# Patient Record
Sex: Female | Born: 1980 | Race: Black or African American | Hispanic: No | Marital: Married | Smoking: Never smoker
Health system: Southern US, Community
[De-identification: ages and names within clinical notes are randomized; demographics above are authoritative.]

## PROBLEM LIST (undated history)

## (undated) ENCOUNTER — Inpatient Hospital Stay (HOSPITAL_COMMUNITY): Payer: Self-pay

## (undated) DIAGNOSIS — D219 Benign neoplasm of connective and other soft tissue, unspecified: Secondary | ICD-10-CM

## (undated) DIAGNOSIS — IMO0001 Reserved for inherently not codable concepts without codable children: Secondary | ICD-10-CM

## (undated) HISTORY — DX: Reserved for inherently not codable concepts without codable children: IMO0001

## (undated) HISTORY — PX: REDUCTION MAMMAPLASTY: SUR839

## (undated) HISTORY — PX: BREAST SURGERY: SHX581

---
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
## 2003-01-16 ENCOUNTER — Emergency Department (HOSPITAL_COMMUNITY): Admission: EM | Admit: 2003-01-16 | Discharge: 2003-01-17 | Payer: Self-pay | Admitting: Emergency Medicine

## 2003-01-16 ENCOUNTER — Encounter: Payer: Self-pay | Admitting: Emergency Medicine

## 2007-06-01 ENCOUNTER — Emergency Department (HOSPITAL_COMMUNITY): Admission: EM | Admit: 2007-06-01 | Discharge: 2007-06-01 | Payer: Self-pay | Admitting: Emergency Medicine

## 2007-08-19 ENCOUNTER — Emergency Department (HOSPITAL_COMMUNITY): Admission: EM | Admit: 2007-08-19 | Discharge: 2007-08-19 | Payer: Self-pay | Admitting: Family Medicine

## 2008-10-10 ENCOUNTER — Other Ambulatory Visit: Admission: RE | Admit: 2008-10-10 | Discharge: 2008-10-10 | Payer: Self-pay | Admitting: Gynecology

## 2008-10-10 ENCOUNTER — Encounter: Payer: Self-pay | Admitting: Women's Health

## 2008-10-10 ENCOUNTER — Ambulatory Visit: Payer: Self-pay | Admitting: Women's Health

## 2008-12-20 ENCOUNTER — Ambulatory Visit: Payer: Self-pay | Admitting: Women's Health

## 2009-03-06 ENCOUNTER — Ambulatory Visit: Payer: Self-pay | Admitting: Gynecology

## 2009-03-13 ENCOUNTER — Ambulatory Visit: Payer: Self-pay | Admitting: Gynecology

## 2009-03-17 ENCOUNTER — Ambulatory Visit: Payer: Self-pay | Admitting: Obstetrics and Gynecology

## 2009-03-17 DIAGNOSIS — IMO0001 Reserved for inherently not codable concepts without codable children: Secondary | ICD-10-CM

## 2009-03-17 HISTORY — DX: Reserved for inherently not codable concepts without codable children: IMO0001

## 2009-04-14 ENCOUNTER — Ambulatory Visit: Payer: Self-pay | Admitting: Gynecology

## 2009-05-23 ENCOUNTER — Ambulatory Visit: Payer: Self-pay | Admitting: Gynecology

## 2009-10-11 ENCOUNTER — Other Ambulatory Visit: Admission: RE | Admit: 2009-10-11 | Discharge: 2009-10-11 | Payer: Self-pay | Admitting: Gynecology

## 2009-10-11 ENCOUNTER — Ambulatory Visit: Payer: Self-pay | Admitting: Gynecology

## 2009-11-04 ENCOUNTER — Emergency Department (HOSPITAL_COMMUNITY): Admission: EM | Admit: 2009-11-04 | Discharge: 2009-11-04 | Payer: Self-pay | Admitting: Family Medicine

## 2010-02-28 ENCOUNTER — Ambulatory Visit: Payer: Self-pay | Admitting: Gynecology

## 2010-11-29 ENCOUNTER — Emergency Department: Payer: Self-pay | Admitting: Emergency Medicine

## 2011-04-16 DIAGNOSIS — IMO0001 Reserved for inherently not codable concepts without codable children: Secondary | ICD-10-CM | POA: Insufficient documentation

## 2011-10-22 IMAGING — US US PELV - US TRANSVAGINAL
1 series · 17 of 25 positions shown · non-contrast
Comparison: none

REASON FOR EXAM: LLQ pain;  hx of polycystic ovaries
COMMENTS:   LMP: Four weeks ago

PROCEDURE:     US  - US PELVIS EXAM W/TRANSVAGINAL  - November 29, 2010 [DATE]
RESULT:
Transabdominal and endovaginal imaging of the pelvis was obtained.

[Series 1: us pelv - us transvaginal · 17 of 60 slices shown]
[im 1/60]
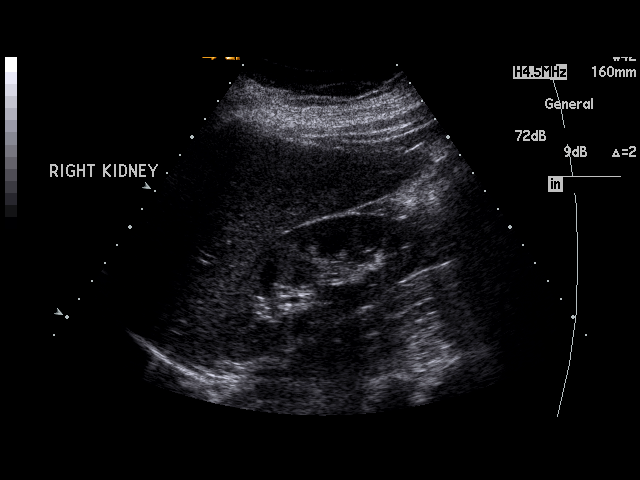
[im 5/60]
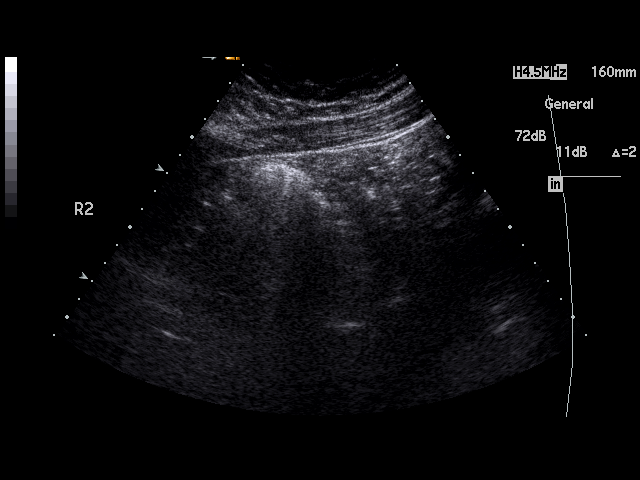
[im 8/60]
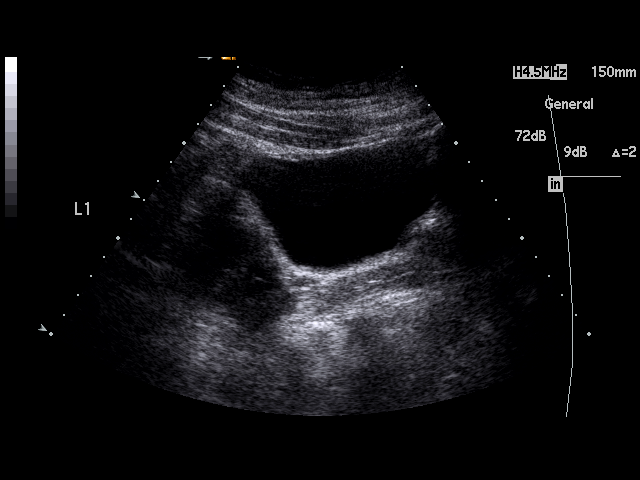
[im 13/60]
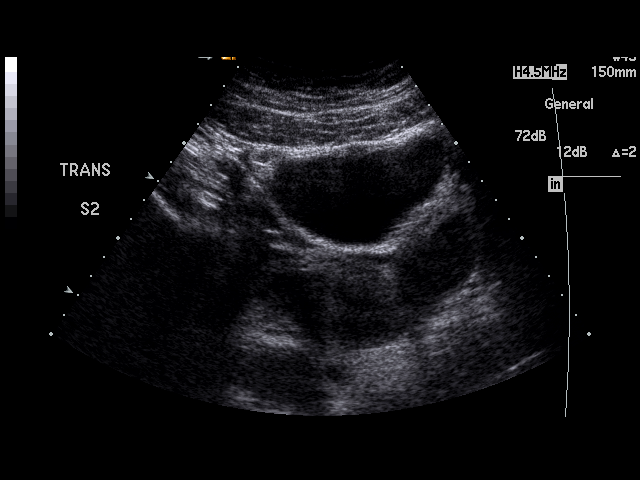
[im 15/60]
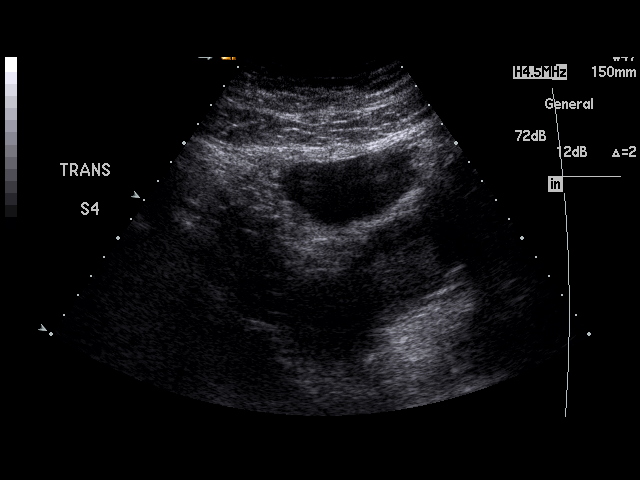
[im 20/60]
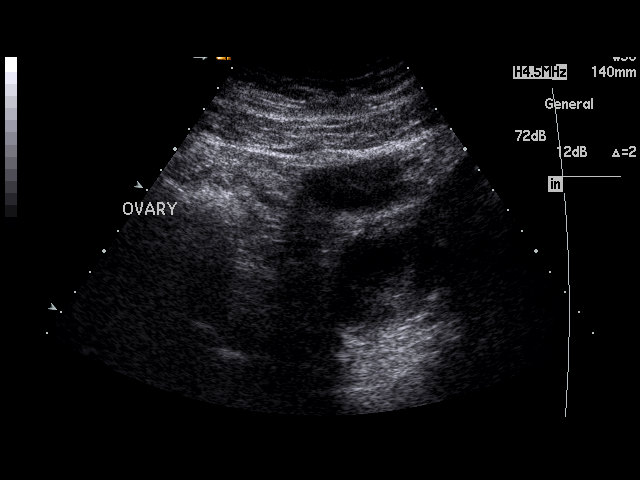
[im 23/60]
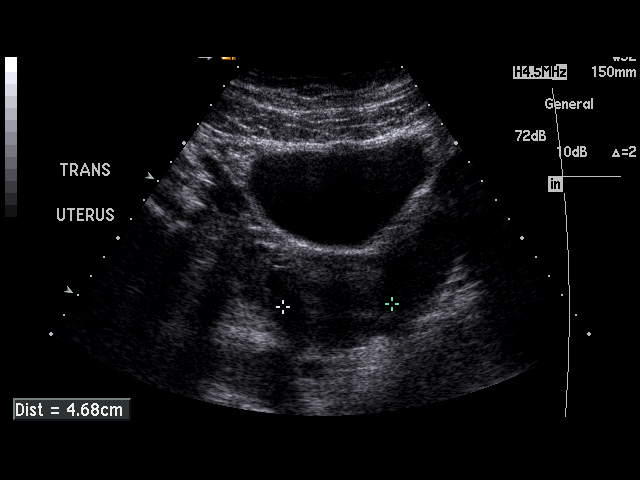
[im 28/60]
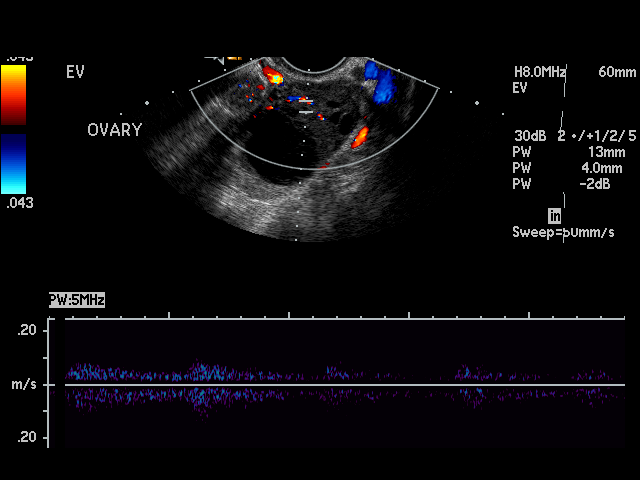
[im 30/60]
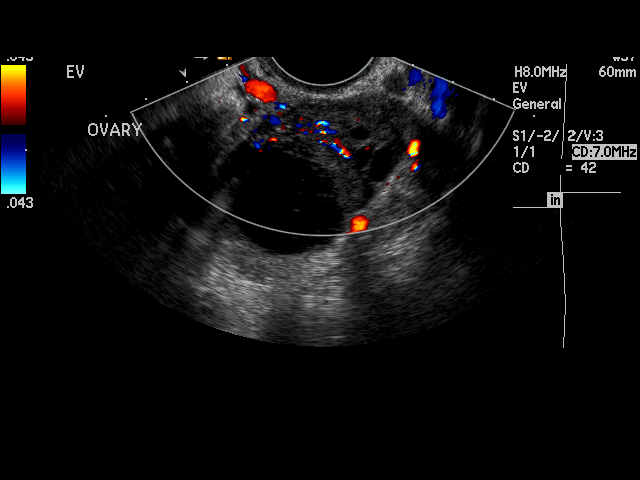
[im 32/60]
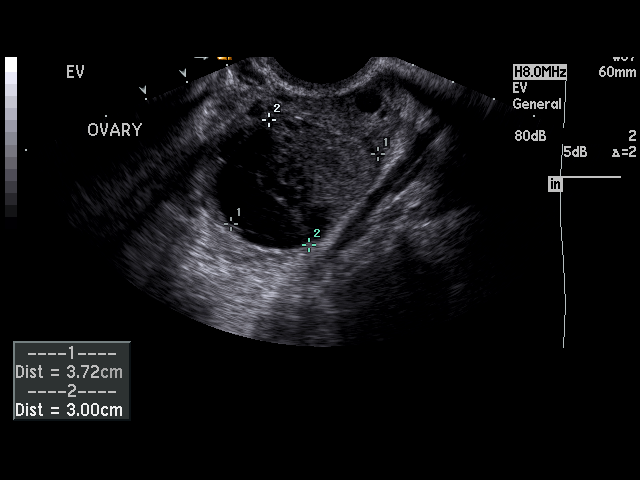
[im 37/60]
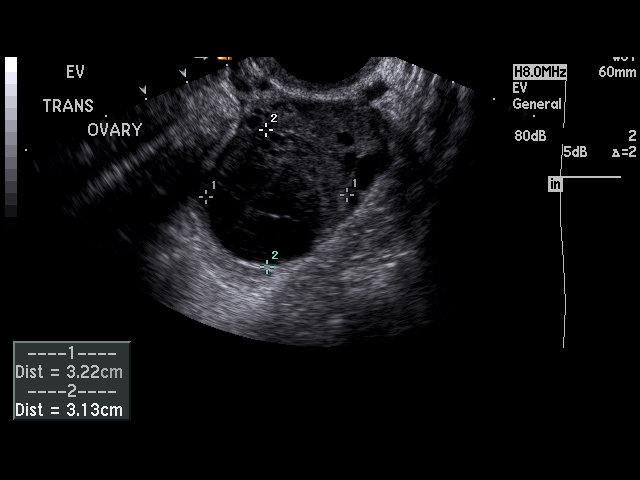
[im 40/60]
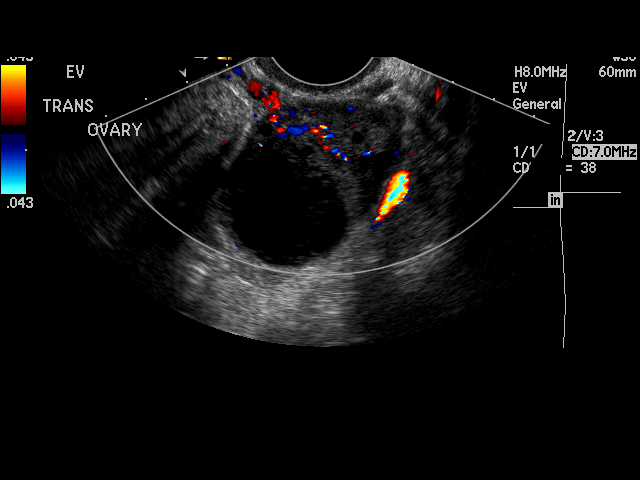
[im 45/60]
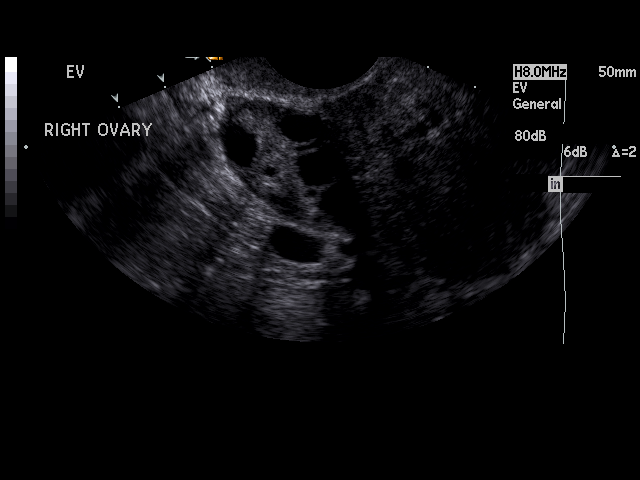
[im 47/60]
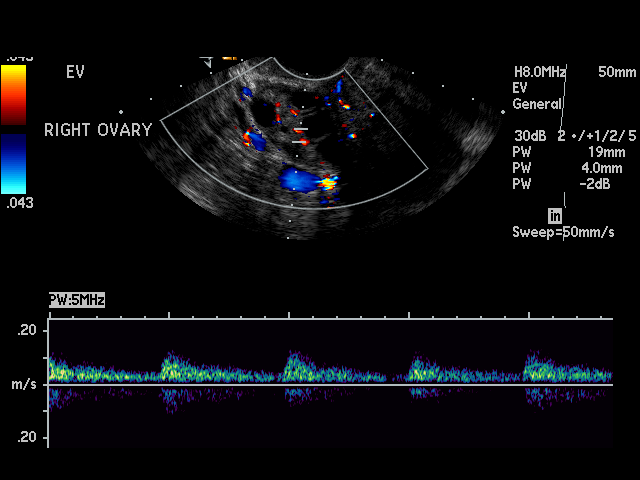
[im 52/60]
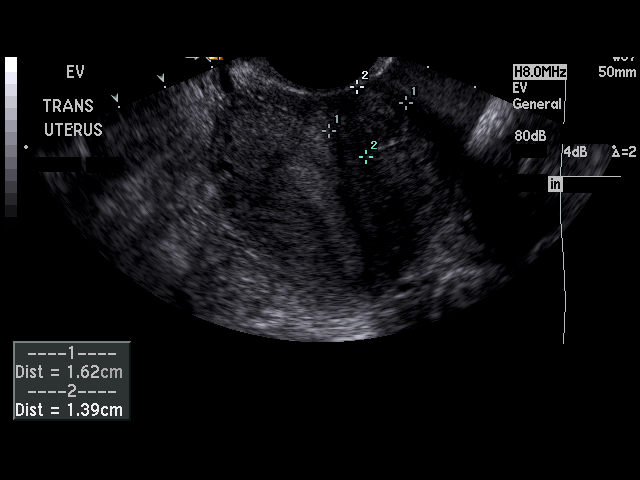
[im 55/60]
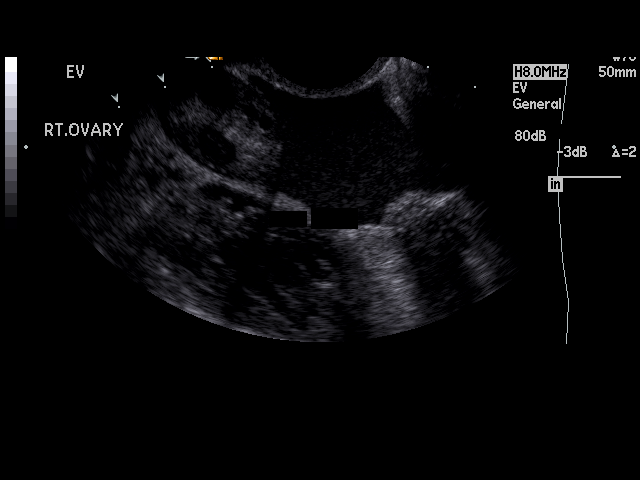
[im 60/60]
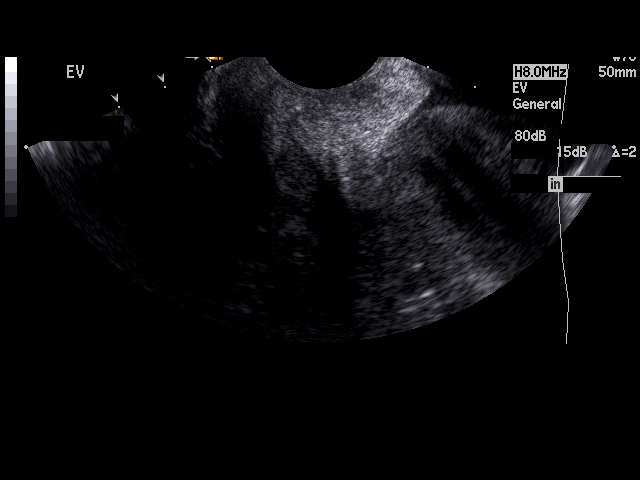

[17 of 25 positions shown; findings below may reference images not displayed]

FINDINGS: The uterus measures 7.73 x 4.21 x 2.68 cm. A leiomyoma is
identified within the uterus measuring 2.02 x 1.83 x 1.62 cm. Endometrial
thickness is 7 mm. The right ovary measures 3.53 x 1.95 x 2.99 cm. Multiple
cysts are identified within the right ovary. The patient has a reported
history of polycystic ovaries.

The left ovary measures 3.34 x 2.09 x 3.14 cm. A complex, partially cystic
mass is appreciated involving the left ovary measuring 3.72 x 3 x 3.22 cm.
This finding may represent the sequela of a hemorrhagic cyst. Color flow is
identified within the right and left ovaries as well as arterial and venous
waveforms.

Complex free fluid is identified within the pelvis. There is no evidence of
hydronephrosis.
IMPRESSION: 1.  Multiple cysts involving the right ovary which may represent the sequela
of polycystic ovarian disease. Alternatively, there areas may represent
multiple follicles and clinical correlation is recommended.
2.  Complex versus hemorrhagic cyst involving the left ovary. There is free
complex fluid within the pelvis and these findings raise suspicion of prior
partial rupture.
3.  Uterine fibroid.
4.  Dr. Palmore of the Emergency Department was informed of these findings via
a preliminary faxed report.

## 2017-02-02 ENCOUNTER — Encounter (HOSPITAL_COMMUNITY): Payer: Self-pay | Admitting: *Deleted

## 2017-02-02 ENCOUNTER — Inpatient Hospital Stay (HOSPITAL_COMMUNITY)
Admission: AD | Admit: 2017-02-02 | Discharge: 2017-02-02 | Disposition: A | Discharge status: Home / Self Care | Payer: BLUE CROSS/BLUE SHIELD | Source: Ambulatory Visit | Attending: Obstetrics and Gynecology | Admitting: Obstetrics and Gynecology

## 2017-02-02 ENCOUNTER — Inpatient Hospital Stay (HOSPITAL_COMMUNITY): Payer: BLUE CROSS/BLUE SHIELD

## 2017-02-02 DIAGNOSIS — O26891 Other specified pregnancy related conditions, first trimester: Secondary | ICD-10-CM | POA: Diagnosis not present

## 2017-02-02 DIAGNOSIS — D259 Leiomyoma of uterus, unspecified: Secondary | ICD-10-CM | POA: Insufficient documentation

## 2017-02-02 DIAGNOSIS — R109 Unspecified abdominal pain: Secondary | ICD-10-CM

## 2017-02-02 DIAGNOSIS — O209 Hemorrhage in early pregnancy, unspecified: Secondary | ICD-10-CM | POA: Diagnosis not present

## 2017-02-02 DIAGNOSIS — O3410 Maternal care for benign tumor of corpus uteri, unspecified trimester: Secondary | ICD-10-CM

## 2017-02-02 DIAGNOSIS — R1030 Lower abdominal pain, unspecified: Secondary | ICD-10-CM | POA: Insufficient documentation

## 2017-02-02 DIAGNOSIS — Z3A00 Weeks of gestation of pregnancy not specified: Secondary | ICD-10-CM | POA: Diagnosis not present

## 2017-02-02 DIAGNOSIS — O3411 Maternal care for benign tumor of corpus uteri, first trimester: Secondary | ICD-10-CM | POA: Diagnosis not present

## 2017-02-02 DIAGNOSIS — N888 Other specified noninflammatory disorders of cervix uteri: Secondary | ICD-10-CM | POA: Diagnosis not present

## 2017-02-02 DIAGNOSIS — O3680X0 Pregnancy with inconclusive fetal viability, not applicable or unspecified: Secondary | ICD-10-CM | POA: Insufficient documentation

## 2017-02-02 HISTORY — DX: Benign neoplasm of connective and other soft tissue, unspecified: D21.9

## 2017-02-02 LAB — URINALYSIS, ROUTINE W REFLEX MICROSCOPIC
Bilirubin Urine: NEGATIVE
Glucose, UA: NEGATIVE mg/dL
Ketones, ur: NEGATIVE mg/dL
Leukocytes, UA: NEGATIVE
Nitrite: NEGATIVE
PROTEIN: NEGATIVE mg/dL
SPECIFIC GRAVITY, URINE: 1.014 (ref 1.005–1.030)
pH: 5 (ref 5.0–8.0)

## 2017-02-02 LAB — CBC
HCT: 32.3 % — ABNORMAL LOW (ref 36.0–46.0)
Hemoglobin: 11.1 g/dL — ABNORMAL LOW (ref 12.0–15.0)
MCH: 27.3 pg (ref 26.0–34.0)
MCHC: 34.4 g/dL (ref 30.0–36.0)
MCV: 79.4 fL (ref 78.0–100.0)
PLATELETS: 274 10*3/uL (ref 150–400)
RBC: 4.07 MIL/uL (ref 3.87–5.11)
RDW: 14 % (ref 11.5–15.5)
WBC: 5.4 10*3/uL (ref 4.0–10.5)

## 2017-02-02 LAB — WET PREP, GENITAL
CLUE CELLS WET PREP: NONE SEEN
Sperm: NONE SEEN
Trich, Wet Prep: NONE SEEN
YEAST WET PREP: NONE SEEN

## 2017-02-02 LAB — ABO/RH: ABO/RH(D): AB POS

## 2017-02-02 LAB — HCG, QUANTITATIVE, PREGNANCY: HCG, BETA CHAIN, QUANT, S: 2418 m[IU]/mL — AB (ref <5)

## 2017-02-02 LAB — POCT PREGNANCY, URINE: PREG TEST UR: POSITIVE — AB

## 2017-02-02 MED ORDER — CONCEPT OB 130-92.4-1 MG PO CAPS: 1.0000 | ORAL_CAPSULE | Freq: Every day | ORAL | 12 refills | Status: DC

## 2017-02-02 NOTE — MAU Provider Note (Signed)
Chief Complaint: Possible Pregnancy; Vaginal Bleeding; and Abdominal Pain   First Provider Initiated Contact with Patient 02/02/17 1649     SUBJECTIVE HPI: Erin Greene is a 36 y.o. G1P0 at [redacted]w[redacted]d who presents to Maternity Admissions reporting vaginal bleeding and mild-moderate low abdominal cramping. Cramping is been going on for 1 week. Vaginal bleeding started immediately prior to coming to maternity admissions. Patient lives in [place] and is visit [place] this weekend. She has been under the care of an infertility specialist, but conceived spontaneously. 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 Had pregnancy confirmed 01/30/2017. HCG level was 400.   Vaginal Bleeding: Small amount Passage of tissue or clots: Denies Dizziness: Denies  AB POS  Pain Location: Suprapubic Quality: Cramping Severity: 4/10 on pain scale Duration: One week Course: Unchanged Context: Early pregnancy Timing: Intermittent Modifying factors: Slightly worse with position changes. Hasn't tried anything for the pain. Associated signs and symptoms: Negative for fever, chills, nausea, vomiting, diarrhea, constipation, urinary complaints, vaginal discharge. Positive for vaginal bleeding.  Past Medical History:  Diagnosis Date  . Fibroid   . IUD 03-17-2009   MIRENA   OB History  Gravida Para Term Preterm AB Living  1            SAB TAB Ectopic Multiple Live Births               # Outcome Date GA Lbr Len/2nd Weight Sex Delivery Anes PTL Lv  1 Current              Past Surgical History:  Procedure Laterality Date  . BREAST SURGERY     reduction   Social History   Social History  . Marital status: Married    Spouse name: N/A  . Number of children: N/A  . Years of education: N/A   Occupational History  . Not on file.   Social History Main Topics  . Smoking status: Never Smoker  . Smokeless tobacco: Never Used  . Alcohol use No     Comment: not since pregnancy  . Drug use: No   . Sexual activity: Yes    Birth control/ protection: None   Other Topics Concern  . Not on file   Social History Narrative  . No narrative on file   No current facility-administered medications on file prior to encounter.                  Allergies  Allergen Reactions  . Apple     I have reviewed the past Medical Hx, Surgical Hx, Social Hx, Allergies and Medications.   Review of Systems  Constitutional: Negative for appetite change, chills and fever.  Gastrointestinal: Positive for abdominal pain. Negative for abdominal distention, constipation, diarrhea, nausea and vomiting.  Genitourinary: Positive for vaginal bleeding. Negative for dysuria, flank pain, frequency, hematuria, urgency, vaginal discharge and vaginal pain.  Musculoskeletal: Negative for back pain.  Neurological: Negative for dizziness.    OBJECTIVE Patient Vitals for the past 24 hrs:  BP Temp Temp src Pulse Resp SpO2 Weight  02/02/17 1459 120/71 98.3 F (36.8 C) Oral 86 18 100 % 236 lb 1.3 oz (107.1 kg)   Constitutional: Well-developed, well-nourished female in no acute distress.  Cardiovascular: normal rate Respiratory: normal rate and effort.  GI: Abd soft, non-tender. Pos BS x 4 MS: Extremities nontender, no edema, normal ROM Neurologic: Alert and oriented x 4.  GU: Neg CVAT.  SPECULUM EXAM: NEFG, physiologic discharge, scant dark red blood  noted, cervix friable  BIMANUAL: cervix closed/very firm; uterus slightly enlarged, no adnexal tenderness or masses. No CMT.  LAB RESULTS Results for orders placed or performed during the hospital encounter of 02/02/17 (from the past 24 hour(s))  Urinalysis, Routine w reflex microscopic     Status: Abnormal   Collection Time: 02/02/17  2:55 PM  Result Value Ref Range   Color, Urine YELLOW YELLOW   APPearance CLEAR CLEAR   Specific Gravity, Urine 1.014 1.005 - 1.030   pH 5.0 5.0 - 8.0   Glucose, UA NEGATIVE NEGATIVE mg/dL   Hgb urine dipstick LARGE (A)  NEGATIVE   Bilirubin Urine NEGATIVE NEGATIVE   Ketones, ur NEGATIVE NEGATIVE mg/dL   Protein, ur NEGATIVE NEGATIVE mg/dL   Nitrite NEGATIVE NEGATIVE   Leukocytes, UA NEGATIVE NEGATIVE   RBC / HPF TOO NUMEROUS TO COUNT 0 - 5 RBC/hpf   WBC, UA 0-5 0 - 5 WBC/hpf   Bacteria, UA RARE (A) NONE SEEN   Squamous Epithelial / LPF 0-5 (A) NONE SEEN  Pregnancy, urine POC     Status: Abnormal   Collection Time: 02/02/17  3:04 PM  Result Value Ref Range   Preg Test, Ur POSITIVE (A) NEGATIVE  hCG, quantitative, pregnancy     Status: Abnormal   Collection Time: 02/02/17  3:31 PM  Result Value Ref Range   hCG, Beta Chain, Quant, S 2,418 (H) <5 mIU/mL  CBC     Status: Abnormal   Collection Time: 02/02/17  3:31 PM  Result Value Ref Range   WBC 5.4 4.0 - 10.5 K/uL   RBC 4.07 3.87 - 5.11 MIL/uL   Hemoglobin 11.1 (L) 12.0 - 15.0 g/dL   HCT 32.3 (L) 36.0 - 46.0 %   MCV 79.4 78.0 - 100.0 fL   MCH 27.3 26.0 - 34.0 pg   MCHC 34.4 30.0 - 36.0 g/dL   RDW 14.0 11.5 - 15.5 %   Platelets 274 150 - 400 K/uL  ABO/Rh     Status: None (Preliminary result)   Collection Time: 02/02/17  3:31 PM  Result Value Ref Range   ABO/RH(D) AB POS   Wet prep, genital     Status: Abnormal   Collection Time: 02/02/17  4:50 PM  Result Value Ref Range   Yeast Wet Prep HPF POC NONE SEEN NONE SEEN   Trich, Wet Prep NONE SEEN NONE SEEN   Clue Cells Wet Prep HPF POC NONE SEEN NONE SEEN   WBC, Wet Prep HPF POC FEW (A) NONE SEEN   Sperm NONE SEEN     IMAGING US Ob Comp Less 14 Wks  Result Date: 02/02/2017 CLINICAL DATA:  Bleeding and cramping. EXAM: OBSTETRIC <14 WK [place] AND TRANSVAGINAL OB US TECHNIQUE: Both transabdominal and transvaginal ultrasound examinations were performed for complete evaluation of the gestation as well as the maternal uterus, adnexal regions, and pelvic cul-de-sac. Transvaginal technique was performed to assess early pregnancy. COMPARISON:  None. FINDINGS: Intrauterine gestational sac: There is a tiny  fluid collection within the endometrium which is nonspecific. Yolk sac:  Not Visualized. Embryo:  Not Visualized. MSD: 3.8  mm   5 w   0  d CRL:    mm    w    d                  [place] EDC: Subchorionic hemorrhage:  None visualized. Maternal uterus/adnexae: Four fibroids are identified in the uterus. The largest measures 2.1 x 1.5 x 1.9 cm. IMPRESSION: 1.  There is a tiny fluid collection within the endometrium measuring 3.8 mm in mean diameter. This is nonspecific but could represent an early gestational sac. Recommend clinical correlation and close follow-up. 2. Fibroid uterus. Electronically Signed   By: Dorise Bullion III M.D   On: 02/02/2017 16:43   US Ob Transvaginal  Result Date: 02/02/2017 CLINICAL DATA:  Bleeding and cramping. EXAM: OBSTETRIC <14 WK [place] AND TRANSVAGINAL OB US TECHNIQUE: Both transabdominal and transvaginal ultrasound examinations were performed for complete evaluation of the gestation as well as the maternal uterus, adnexal regions, and pelvic cul-de-sac. Transvaginal technique was performed to assess early pregnancy. COMPARISON:  None. FINDINGS: Intrauterine gestational sac: There is a tiny fluid collection within the endometrium which is nonspecific. Yolk sac:  Not Visualized. Embryo:  Not Visualized. MSD: 3.8  mm   5 w   0  d CRL:    mm    w    d                  [place] EDC: Subchorionic hemorrhage:  None visualized. Maternal uterus/adnexae: Four fibroids are identified in the uterus. The largest measures 2.1 x 1.5 x 1.9 cm. IMPRESSION: 1. There is a tiny fluid collection within the endometrium measuring 3.8 mm in mean diameter. This is nonspecific but could represent an early gestational sac. Recommend clinical correlation and close follow-up. 2. Fibroid uterus. Electronically Signed   By: Dorise Bullion III M.D   On: 02/02/2017 16:43    MAU COURSE CBC, Quant, ABO/Rh, ultrasound, wet prep and GC/chlamydia culture, UA  MDM - Pain and bleeding in early pregnancy with pregnancy of  unknown anatomic location, but hemodynamically stable.  - Normal rise in [place] from [place] in [place], although these tests were not drawn from same lab and should be compared with caution. - Bleeding may from friable cervix.   ASSESSMENT 1. Pregnancy of unknown anatomic location   2. Vaginal bleeding in pregnancy, first trimester   3. Abdominal pain during pregnancy in first trimester   4. Friable cervix   5. Uterine fibroid during pregnancy, antepartum     PLAN Discharge home in stable condition. Ectopic, SAB precautions Follow-up Information    OB/GYN Follow up on 02/04/2017.   Why:  For repeat hCG level (pregnancy hormone)       maternity admissions or emergency department Follow up.   Why:  As needed if symptoms worsen         Allergies as of 02/02/2017      Reactions   Apple       Medication List    STOP taking these medications   levonorgestrel 20 MCG/24HR IUD Commonly known as:  MIRENA     TAKE these medications   CONCEPT OB 130-92.4-1 MG Caps Take 1 tablet by mouth daily.            Discharge Care Instructions        Start     Ordered   02/02/17 0000  Prenat w/o A Vit-[id number]-FePo-[id number] (CONCEPT OB) 130-92.4-1 MG CAPS  Daily    Question:  Supervising Provider  Answer:  Jonnie Kind   02/02/17 1739   02/02/17 0000  Discharge patient    Question Answer Comment  Discharge disposition 01-Home or Self Care   Discharge patient date 02/02/2017      02/02/17 [place], [place], [place] 02/02/2017  5:40 PM  4

## 2017-02-02 NOTE — MAU Note (Signed)
+  vaginal bleeding Started today about 30 minutes Red in color; denies clots  +lower abdominal cramping Rating pain 4/10  +headache Rating pain 6/10 Anterior   LMP 7/19

## 2017-02-02 NOTE — Discharge Instructions (Signed)

## 2017-02-03 LAB — HIV ANTIBODY (ROUTINE TESTING W REFLEX): HIV SCREEN 4TH GENERATION: NONREACTIVE

## 2017-02-03 LAB — GC/CHLAMYDIA PROBE AMP (~~LOC~~) NOT AT ARMC
Chlamydia: NEGATIVE
Neisseria Gonorrhea: NEGATIVE

## 2017-12-25 ENCOUNTER — Encounter (HOSPITAL_COMMUNITY): Payer: Self-pay

## 2018-06-26 IMAGING — US US OB COMP LESS 14 WK
1 series · 15 of 28 positions shown · non-contrast
Comparison: None.

CLINICAL DATA: Bleeding and cramping.

EXAM:
OBSTETRIC <14 WK US AND TRANSVAGINAL OB US
TECHNIQUE: Both transabdominal and transvaginal ultrasound examinations were
performed for complete evaluation of the gestation as well as the
maternal uterus, adnexal regions, and pelvic cul-de-sac.
Transvaginal technique was performed to assess early pregnancy.

[Series 1: us ob comp less 14 wk · 15 of 53 slices shown]
[im 1/53]
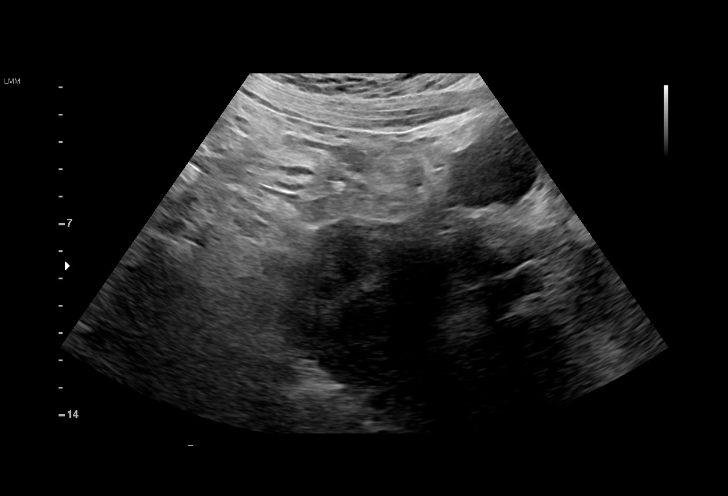
[im 4/53]
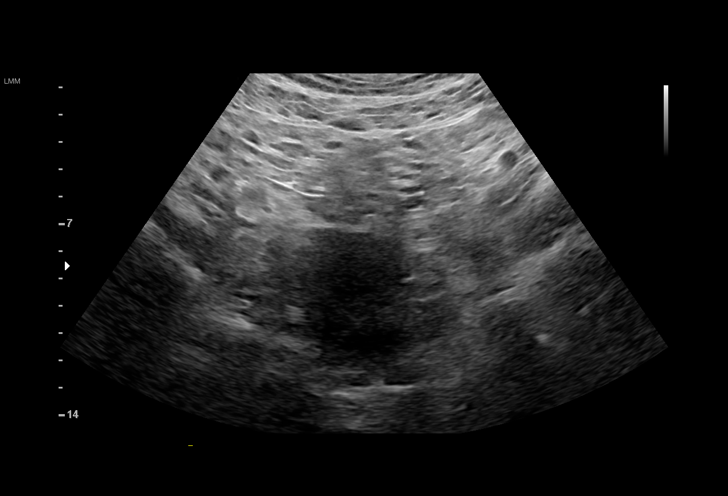
[im 8/53]
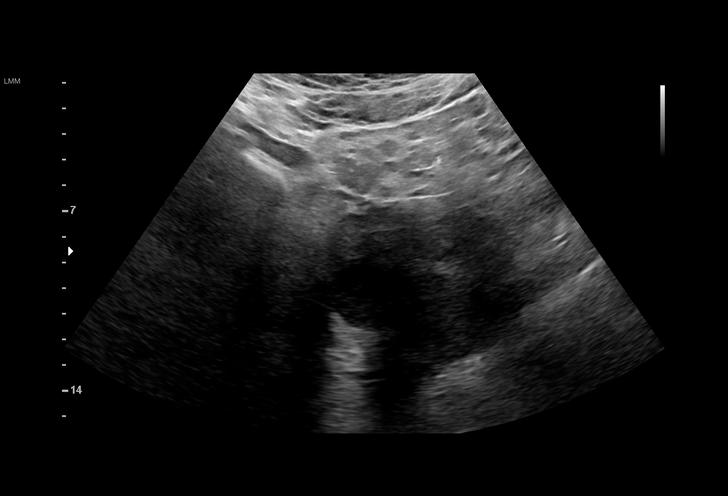
[im 12/53]
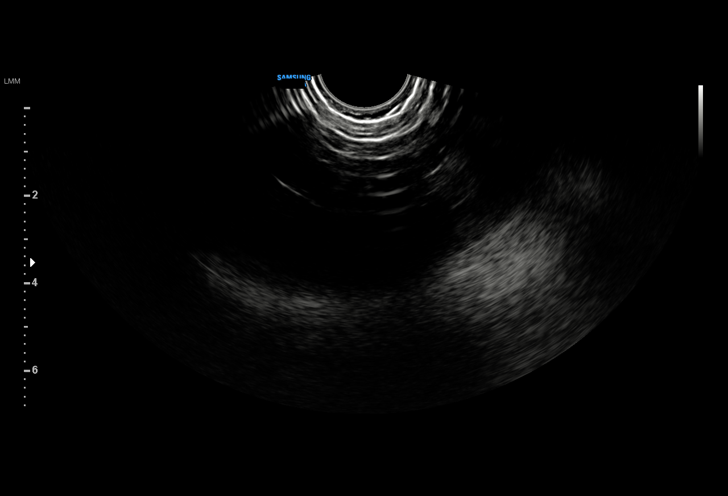
[im 16/53]
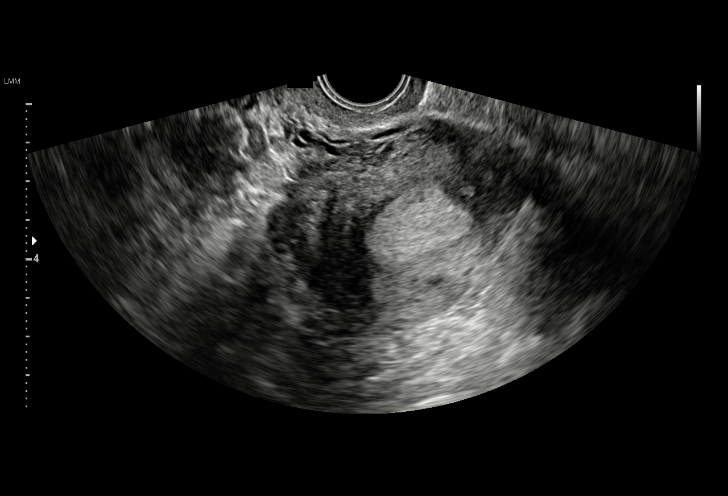
[im 20/53]
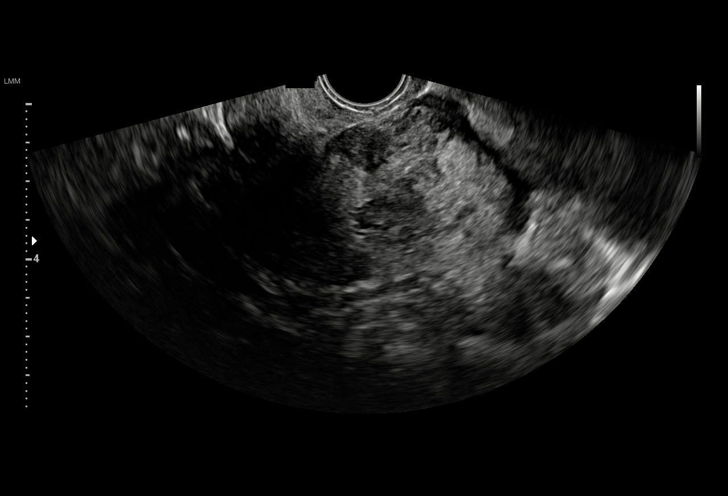
[im 24/53]
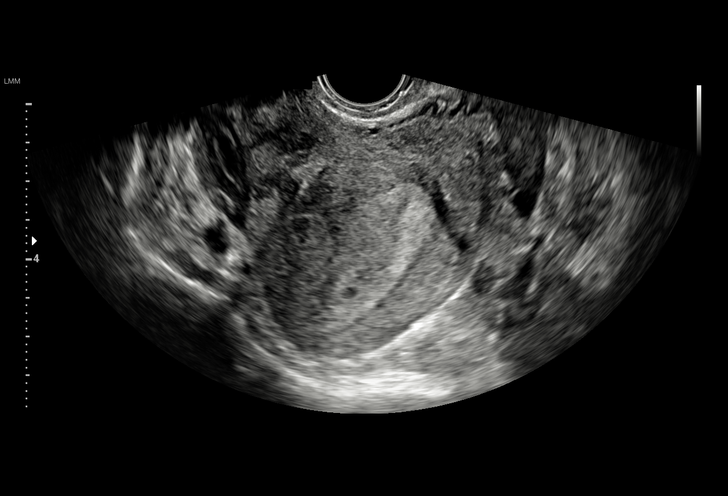
[im 27/53]
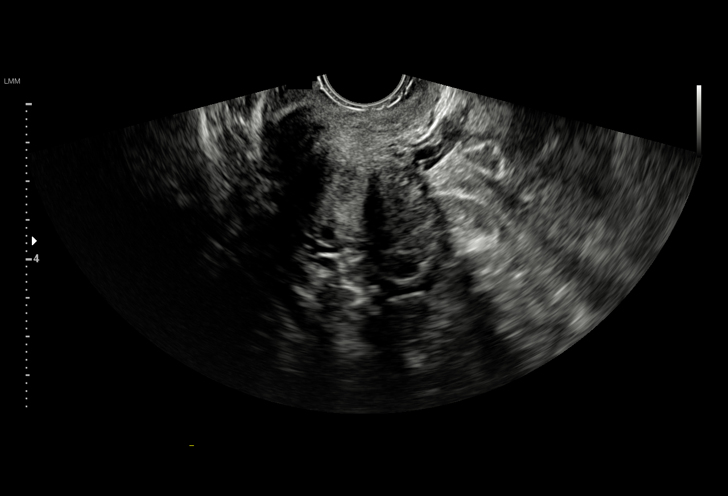
[im 29/53]
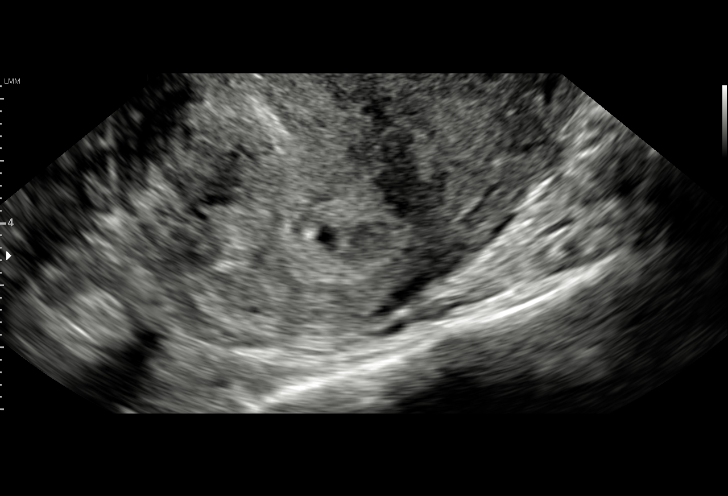
[im 33/53]
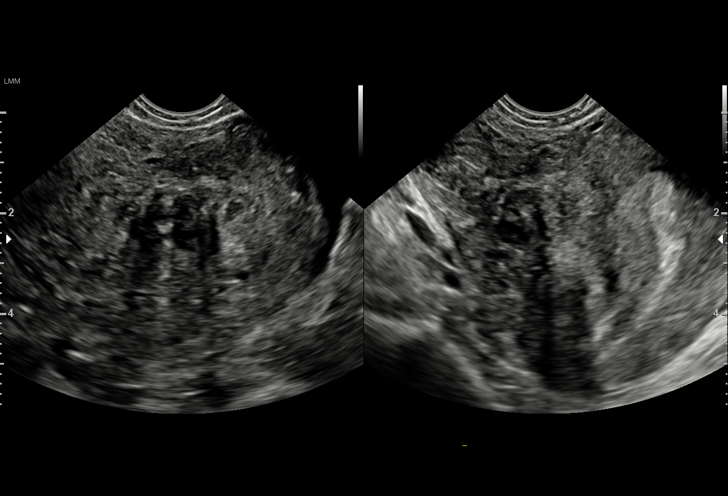
[im 37/53]
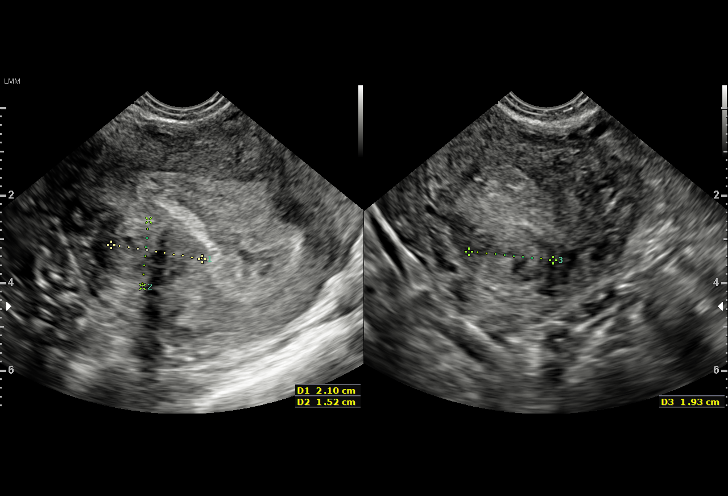
[im 41/53]
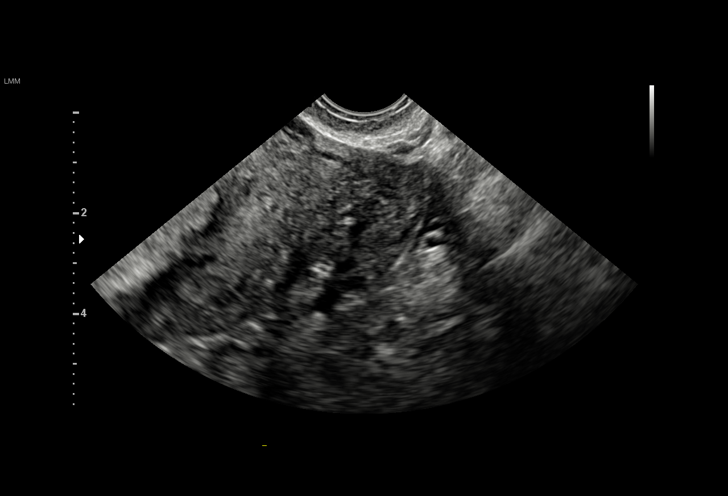
[im 45/53]
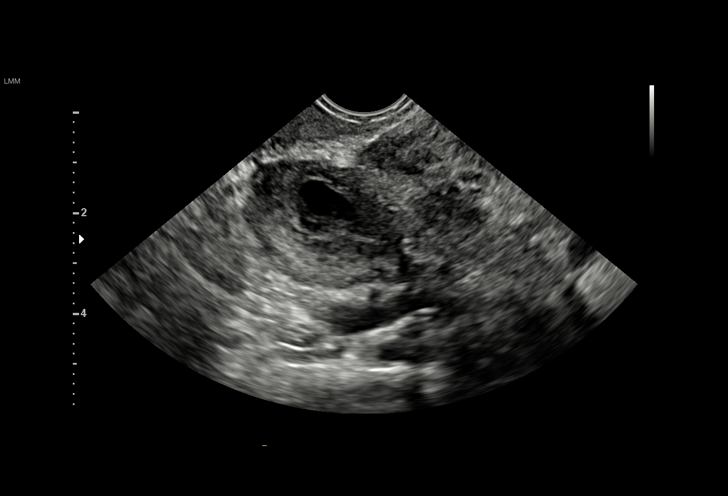
[im 49/53]
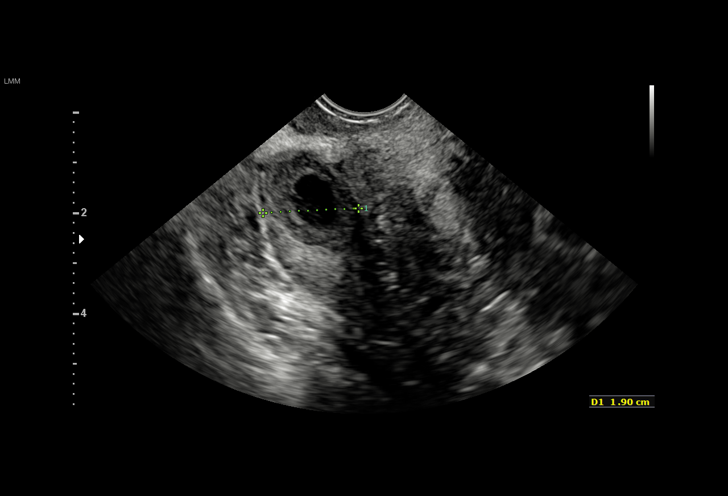
[im 53/53]
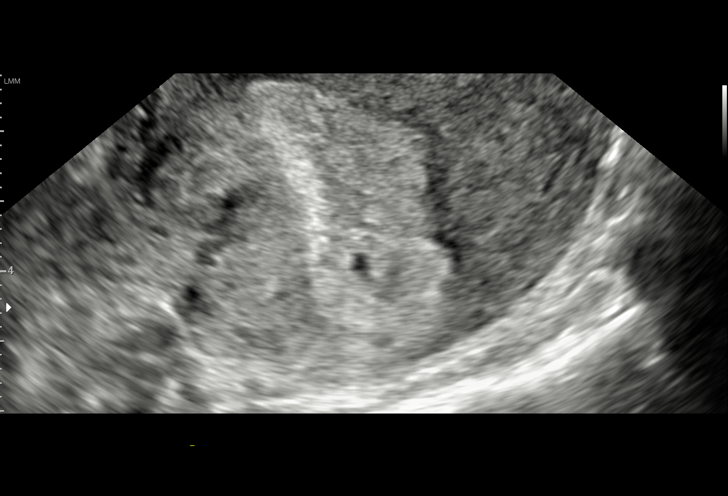

[15 of 28 positions shown; findings below may reference images not displayed]

FINDINGS: Intrauterine gestational sac: There is a tiny fluid collection
within the endometrium which is nonspecific.

Yolk sac:  Not Visualized.

Embryo:  Not Visualized.

MSD: 3.8  mm   5 w   0  d

CRL:    mm    w    d                  US EDC:

Subchorionic hemorrhage:  None visualized.

Maternal uterus/adnexae: Four fibroids are identified in the uterus.
The largest measures 2.1 x 1.5 x 1.9 cm.
IMPRESSION: 1. There is a tiny fluid collection within the endometrium measuring
3.8 mm in mean diameter. This is nonspecific but could represent an
early gestational sac. Recommend clinical correlation and close
follow-up.
2. Fibroid uterus.

## 2021-09-19 DIAGNOSIS — F4325 Adjustment disorder with mixed disturbance of emotions and conduct: Secondary | ICD-10-CM | POA: Diagnosis not present

## 2022-01-19 DIAGNOSIS — M542 Cervicalgia: Secondary | ICD-10-CM | POA: Diagnosis not present

## 2022-01-19 DIAGNOSIS — E669 Obesity, unspecified: Secondary | ICD-10-CM | POA: Diagnosis not present

## 2022-01-19 DIAGNOSIS — M545 Low back pain, unspecified: Secondary | ICD-10-CM | POA: Diagnosis not present

## 2022-05-24 DIAGNOSIS — J101 Influenza due to other identified influenza virus with other respiratory manifestations: Secondary | ICD-10-CM | POA: Diagnosis not present

## 2022-05-24 DIAGNOSIS — J209 Acute bronchitis, unspecified: Secondary | ICD-10-CM | POA: Diagnosis not present

## 2022-05-29 DIAGNOSIS — E559 Vitamin D deficiency, unspecified: Secondary | ICD-10-CM | POA: Diagnosis not present

## 2022-05-29 DIAGNOSIS — Z7689 Persons encountering health services in other specified circumstances: Secondary | ICD-10-CM | POA: Diagnosis not present

## 2022-05-29 DIAGNOSIS — J111 Influenza due to unidentified influenza virus with other respiratory manifestations: Secondary | ICD-10-CM | POA: Diagnosis not present

## 2022-05-29 DIAGNOSIS — R002 Palpitations: Secondary | ICD-10-CM | POA: Diagnosis not present

## 2022-05-29 DIAGNOSIS — R638 Other symptoms and signs concerning food and fluid intake: Secondary | ICD-10-CM | POA: Diagnosis not present

## 2022-05-29 DIAGNOSIS — Z6841 Body Mass Index (BMI) 40.0 and over, adult: Secondary | ICD-10-CM | POA: Diagnosis not present

## 2022-06-20 DIAGNOSIS — Z13220 Encounter for screening for lipoid disorders: Secondary | ICD-10-CM | POA: Diagnosis not present

## 2022-06-20 DIAGNOSIS — Z23 Encounter for immunization: Secondary | ICD-10-CM | POA: Diagnosis not present

## 2022-06-20 DIAGNOSIS — Z6841 Body Mass Index (BMI) 40.0 and over, adult: Secondary | ICD-10-CM | POA: Diagnosis not present

## 2022-06-20 DIAGNOSIS — Z0000 Encounter for general adult medical examination without abnormal findings: Secondary | ICD-10-CM | POA: Diagnosis not present

## 2022-06-20 DIAGNOSIS — R0609 Other forms of dyspnea: Secondary | ICD-10-CM | POA: Diagnosis not present

## 2022-06-20 DIAGNOSIS — E559 Vitamin D deficiency, unspecified: Secondary | ICD-10-CM | POA: Diagnosis not present

## 2022-07-04 DIAGNOSIS — U071 COVID-19: Secondary | ICD-10-CM | POA: Diagnosis not present

## 2022-07-25 DIAGNOSIS — R7303 Prediabetes: Secondary | ICD-10-CM | POA: Diagnosis not present

## 2022-07-25 DIAGNOSIS — Z6841 Body Mass Index (BMI) 40.0 and over, adult: Secondary | ICD-10-CM | POA: Diagnosis not present

## 2022-07-25 DIAGNOSIS — E559 Vitamin D deficiency, unspecified: Secondary | ICD-10-CM | POA: Diagnosis not present

## 2022-07-25 DIAGNOSIS — D500 Iron deficiency anemia secondary to blood loss (chronic): Secondary | ICD-10-CM | POA: Diagnosis not present

## 2022-08-08 DIAGNOSIS — D500 Iron deficiency anemia secondary to blood loss (chronic): Secondary | ICD-10-CM | POA: Diagnosis not present

## 2022-08-08 DIAGNOSIS — E559 Vitamin D deficiency, unspecified: Secondary | ICD-10-CM | POA: Diagnosis not present

## 2022-08-08 DIAGNOSIS — R7303 Prediabetes: Secondary | ICD-10-CM | POA: Diagnosis not present

## 2022-08-08 DIAGNOSIS — Z6841 Body Mass Index (BMI) 40.0 and over, adult: Secondary | ICD-10-CM | POA: Diagnosis not present

## 2022-08-29 DIAGNOSIS — R7303 Prediabetes: Secondary | ICD-10-CM | POA: Diagnosis not present

## 2022-08-29 DIAGNOSIS — E559 Vitamin D deficiency, unspecified: Secondary | ICD-10-CM | POA: Diagnosis not present

## 2022-08-29 DIAGNOSIS — Z6841 Body Mass Index (BMI) 40.0 and over, adult: Secondary | ICD-10-CM | POA: Diagnosis not present

## 2022-09-06 DIAGNOSIS — M2012 Hallux valgus (acquired), left foot: Secondary | ICD-10-CM | POA: Diagnosis not present

## 2022-09-06 DIAGNOSIS — M792 Neuralgia and neuritis, unspecified: Secondary | ICD-10-CM | POA: Diagnosis not present

## 2022-09-06 DIAGNOSIS — Q6652 Congenital pes planus, left foot: Secondary | ICD-10-CM | POA: Diagnosis not present

## 2022-09-09 ENCOUNTER — Other Ambulatory Visit: Payer: Self-pay | Admitting: Nurse Practitioner

## 2022-09-09 DIAGNOSIS — Q6652 Congenital pes planus, left foot: Secondary | ICD-10-CM | POA: Diagnosis not present

## 2022-09-09 DIAGNOSIS — Z124 Encounter for screening for malignant neoplasm of cervix: Secondary | ICD-10-CM | POA: Diagnosis not present

## 2022-09-09 DIAGNOSIS — Z0000 Encounter for general adult medical examination without abnormal findings: Secondary | ICD-10-CM

## 2022-09-09 DIAGNOSIS — N920 Excessive and frequent menstruation with regular cycle: Secondary | ICD-10-CM | POA: Diagnosis not present

## 2022-09-09 DIAGNOSIS — Z1151 Encounter for screening for human papillomavirus (HPV): Secondary | ICD-10-CM | POA: Diagnosis not present

## 2022-09-09 DIAGNOSIS — D500 Iron deficiency anemia secondary to blood loss (chronic): Secondary | ICD-10-CM | POA: Diagnosis not present

## 2022-09-09 DIAGNOSIS — D259 Leiomyoma of uterus, unspecified: Secondary | ICD-10-CM | POA: Diagnosis not present

## 2022-09-09 DIAGNOSIS — M792 Neuralgia and neuritis, unspecified: Secondary | ICD-10-CM | POA: Diagnosis not present

## 2022-09-09 DIAGNOSIS — M2012 Hallux valgus (acquired), left foot: Secondary | ICD-10-CM | POA: Diagnosis not present

## 2022-09-09 DIAGNOSIS — M779 Enthesopathy, unspecified: Secondary | ICD-10-CM | POA: Diagnosis not present

## 2022-09-09 DIAGNOSIS — Z01419 Encounter for gynecological examination (general) (routine) without abnormal findings: Secondary | ICD-10-CM | POA: Diagnosis not present

## 2022-09-09 DIAGNOSIS — R102 Pelvic and perineal pain: Secondary | ICD-10-CM | POA: Diagnosis not present

## 2022-09-17 DIAGNOSIS — Z6841 Body Mass Index (BMI) 40.0 and over, adult: Secondary | ICD-10-CM | POA: Diagnosis not present

## 2022-09-17 DIAGNOSIS — R7303 Prediabetes: Secondary | ICD-10-CM | POA: Diagnosis not present

## 2022-09-17 DIAGNOSIS — E559 Vitamin D deficiency, unspecified: Secondary | ICD-10-CM | POA: Diagnosis not present

## 2022-10-10 DIAGNOSIS — N920 Excessive and frequent menstruation with regular cycle: Secondary | ICD-10-CM | POA: Diagnosis not present

## 2022-10-10 DIAGNOSIS — R102 Pelvic and perineal pain: Secondary | ICD-10-CM | POA: Diagnosis not present

## 2022-10-10 DIAGNOSIS — D259 Leiomyoma of uterus, unspecified: Secondary | ICD-10-CM | POA: Diagnosis not present

## 2022-10-15 DIAGNOSIS — E559 Vitamin D deficiency, unspecified: Secondary | ICD-10-CM | POA: Diagnosis not present

## 2022-10-15 DIAGNOSIS — N920 Excessive and frequent menstruation with regular cycle: Secondary | ICD-10-CM | POA: Diagnosis not present

## 2022-10-15 DIAGNOSIS — Z6839 Body mass index (BMI) 39.0-39.9, adult: Secondary | ICD-10-CM | POA: Diagnosis not present

## 2022-10-15 DIAGNOSIS — R7303 Prediabetes: Secondary | ICD-10-CM | POA: Diagnosis not present

## 2022-11-11 ENCOUNTER — Ambulatory Visit
Admission: RE | Admit: 2022-11-11 | Discharge: 2022-11-11 | Disposition: A | Discharge status: Home / Self Care | Payer: 59 | Source: Ambulatory Visit | Attending: Nurse Practitioner | Admitting: Nurse Practitioner

## 2022-11-11 DIAGNOSIS — Z0000 Encounter for general adult medical examination without abnormal findings: Secondary | ICD-10-CM

## 2022-11-11 DIAGNOSIS — Z1231 Encounter for screening mammogram for malignant neoplasm of breast: Secondary | ICD-10-CM | POA: Diagnosis not present

## 2022-11-12 DIAGNOSIS — N920 Excessive and frequent menstruation with regular cycle: Secondary | ICD-10-CM | POA: Diagnosis not present

## 2022-11-12 DIAGNOSIS — R7303 Prediabetes: Secondary | ICD-10-CM | POA: Diagnosis not present

## 2022-11-12 DIAGNOSIS — Z9189 Other specified personal risk factors, not elsewhere classified: Secondary | ICD-10-CM | POA: Diagnosis not present

## 2022-11-12 DIAGNOSIS — E559 Vitamin D deficiency, unspecified: Secondary | ICD-10-CM | POA: Diagnosis not present

## 2022-11-12 DIAGNOSIS — Z6839 Body mass index (BMI) 39.0-39.9, adult: Secondary | ICD-10-CM | POA: Diagnosis not present

## 2022-12-05 DIAGNOSIS — N920 Excessive and frequent menstruation with regular cycle: Secondary | ICD-10-CM | POA: Diagnosis not present

## 2022-12-05 DIAGNOSIS — R102 Pelvic and perineal pain: Secondary | ICD-10-CM | POA: Diagnosis not present

## 2022-12-05 DIAGNOSIS — D219 Benign neoplasm of connective and other soft tissue, unspecified: Secondary | ICD-10-CM | POA: Diagnosis not present

## 2022-12-10 DIAGNOSIS — Z6839 Body mass index (BMI) 39.0-39.9, adult: Secondary | ICD-10-CM | POA: Diagnosis not present

## 2022-12-10 DIAGNOSIS — E559 Vitamin D deficiency, unspecified: Secondary | ICD-10-CM | POA: Diagnosis not present

## 2022-12-10 DIAGNOSIS — Z9189 Other specified personal risk factors, not elsewhere classified: Secondary | ICD-10-CM | POA: Diagnosis not present

## 2022-12-10 DIAGNOSIS — N920 Excessive and frequent menstruation with regular cycle: Secondary | ICD-10-CM | POA: Diagnosis not present

## 2022-12-10 DIAGNOSIS — R7303 Prediabetes: Secondary | ICD-10-CM | POA: Diagnosis not present

## 2022-12-31 DIAGNOSIS — R7303 Prediabetes: Secondary | ICD-10-CM | POA: Diagnosis not present

## 2022-12-31 DIAGNOSIS — D500 Iron deficiency anemia secondary to blood loss (chronic): Secondary | ICD-10-CM | POA: Diagnosis not present

## 2022-12-31 DIAGNOSIS — E559 Vitamin D deficiency, unspecified: Secondary | ICD-10-CM | POA: Diagnosis not present

## 2023-01-14 DIAGNOSIS — R7303 Prediabetes: Secondary | ICD-10-CM | POA: Diagnosis not present

## 2023-01-14 DIAGNOSIS — E559 Vitamin D deficiency, unspecified: Secondary | ICD-10-CM | POA: Diagnosis not present

## 2023-01-14 DIAGNOSIS — Z6835 Body mass index (BMI) 35.0-35.9, adult: Secondary | ICD-10-CM | POA: Diagnosis not present

## 2023-01-14 DIAGNOSIS — N920 Excessive and frequent menstruation with regular cycle: Secondary | ICD-10-CM | POA: Diagnosis not present

## 2023-01-14 DIAGNOSIS — Z9189 Other specified personal risk factors, not elsewhere classified: Secondary | ICD-10-CM | POA: Diagnosis not present

## 2023-02-11 DIAGNOSIS — N920 Excessive and frequent menstruation with regular cycle: Secondary | ICD-10-CM | POA: Diagnosis not present

## 2023-02-11 DIAGNOSIS — Z9189 Other specified personal risk factors, not elsewhere classified: Secondary | ICD-10-CM | POA: Diagnosis not present

## 2023-02-11 DIAGNOSIS — E559 Vitamin D deficiency, unspecified: Secondary | ICD-10-CM | POA: Diagnosis not present

## 2023-02-11 DIAGNOSIS — R7303 Prediabetes: Secondary | ICD-10-CM | POA: Diagnosis not present

## 2023-02-11 DIAGNOSIS — Z6835 Body mass index (BMI) 35.0-35.9, adult: Secondary | ICD-10-CM | POA: Diagnosis not present

## 2023-03-21 DIAGNOSIS — R109 Unspecified abdominal pain: Secondary | ICD-10-CM | POA: Diagnosis not present

## 2023-04-08 DIAGNOSIS — E559 Vitamin D deficiency, unspecified: Secondary | ICD-10-CM | POA: Diagnosis not present

## 2023-04-08 DIAGNOSIS — R202 Paresthesia of skin: Secondary | ICD-10-CM | POA: Diagnosis not present

## 2023-04-18 DIAGNOSIS — Z6832 Body mass index (BMI) 32.0-32.9, adult: Secondary | ICD-10-CM | POA: Diagnosis not present

## 2023-04-18 DIAGNOSIS — N644 Mastodynia: Secondary | ICD-10-CM | POA: Diagnosis not present

## 2023-04-23 ENCOUNTER — Telehealth: Payer: Self-pay

## 2023-04-23 ENCOUNTER — Other Ambulatory Visit: Payer: Self-pay | Admitting: Physician Assistant

## 2023-04-23 DIAGNOSIS — N644 Mastodynia: Secondary | ICD-10-CM

## 2023-04-23 NOTE — Telephone Encounter (Signed)
Patient called with questions about [facility] screening process. Patient stated she exceeded federal poverty guideline.

## 2023-04-28 ENCOUNTER — Ambulatory Visit: Payer: 59

## 2023-04-28 ENCOUNTER — Ambulatory Visit
Admission: RE | Admit: 2023-04-28 | Discharge: 2023-04-28 | Disposition: A | Discharge status: Home / Self Care | Payer: 59 | Source: Ambulatory Visit | Attending: Physician Assistant | Admitting: Physician Assistant

## 2023-04-28 DIAGNOSIS — N644 Mastodynia: Secondary | ICD-10-CM | POA: Diagnosis not present

## 2023-04-28 DIAGNOSIS — N6459 Other signs and symptoms in breast: Secondary | ICD-10-CM | POA: Diagnosis not present

## 2023-05-14 DIAGNOSIS — R011 Cardiac murmur, unspecified: Secondary | ICD-10-CM | POA: Diagnosis not present

## 2023-05-14 DIAGNOSIS — Z6832 Body mass index (BMI) 32.0-32.9, adult: Secondary | ICD-10-CM | POA: Diagnosis not present

## 2023-05-14 DIAGNOSIS — R42 Dizziness and giddiness: Secondary | ICD-10-CM | POA: Diagnosis not present

## 2023-05-14 DIAGNOSIS — D500 Iron deficiency anemia secondary to blood loss (chronic): Secondary | ICD-10-CM | POA: Diagnosis not present

## 2023-05-14 DIAGNOSIS — Z87898 Personal history of other specified conditions: Secondary | ICD-10-CM | POA: Diagnosis not present

## 2023-06-02 DIAGNOSIS — E559 Vitamin D deficiency, unspecified: Secondary | ICD-10-CM | POA: Diagnosis not present

## 2023-06-02 DIAGNOSIS — Z9189 Other specified personal risk factors, not elsewhere classified: Secondary | ICD-10-CM | POA: Diagnosis not present

## 2023-06-02 DIAGNOSIS — K5909 Other constipation: Secondary | ICD-10-CM | POA: Diagnosis not present

## 2023-06-02 DIAGNOSIS — N920 Excessive and frequent menstruation with regular cycle: Secondary | ICD-10-CM | POA: Diagnosis not present

## 2023-06-02 DIAGNOSIS — R7303 Prediabetes: Secondary | ICD-10-CM | POA: Diagnosis not present

## 2023-06-06 DIAGNOSIS — R011 Cardiac murmur, unspecified: Secondary | ICD-10-CM | POA: Diagnosis not present

## 2023-06-25 DIAGNOSIS — E7800 Pure hypercholesterolemia, unspecified: Secondary | ICD-10-CM | POA: Diagnosis not present

## 2023-06-25 DIAGNOSIS — D500 Iron deficiency anemia secondary to blood loss (chronic): Secondary | ICD-10-CM | POA: Diagnosis not present

## 2023-06-25 DIAGNOSIS — Z0000 Encounter for general adult medical examination without abnormal findings: Secondary | ICD-10-CM | POA: Diagnosis not present

## 2023-06-25 DIAGNOSIS — Z1389 Encounter for screening for other disorder: Secondary | ICD-10-CM | POA: Diagnosis not present

## 2023-07-01 DIAGNOSIS — Z6831 Body mass index (BMI) 31.0-31.9, adult: Secondary | ICD-10-CM | POA: Diagnosis not present

## 2023-07-01 DIAGNOSIS — R7303 Prediabetes: Secondary | ICD-10-CM | POA: Diagnosis not present

## 2023-07-01 DIAGNOSIS — N920 Excessive and frequent menstruation with regular cycle: Secondary | ICD-10-CM | POA: Diagnosis not present

## 2023-07-01 DIAGNOSIS — E559 Vitamin D deficiency, unspecified: Secondary | ICD-10-CM | POA: Diagnosis not present

## 2023-07-01 DIAGNOSIS — Z79899 Other long term (current) drug therapy: Secondary | ICD-10-CM | POA: Diagnosis not present

## 2023-07-01 DIAGNOSIS — K5909 Other constipation: Secondary | ICD-10-CM | POA: Diagnosis not present

## 2023-08-26 DIAGNOSIS — K5909 Other constipation: Secondary | ICD-10-CM | POA: Diagnosis not present

## 2023-08-26 DIAGNOSIS — R7303 Prediabetes: Secondary | ICD-10-CM | POA: Diagnosis not present

## 2023-08-26 DIAGNOSIS — E559 Vitamin D deficiency, unspecified: Secondary | ICD-10-CM | POA: Diagnosis not present

## 2023-08-26 DIAGNOSIS — N920 Excessive and frequent menstruation with regular cycle: Secondary | ICD-10-CM | POA: Diagnosis not present

## 2023-09-29 DIAGNOSIS — Z6830 Body mass index (BMI) 30.0-30.9, adult: Secondary | ICD-10-CM | POA: Diagnosis not present

## 2023-09-29 DIAGNOSIS — Z79899 Other long term (current) drug therapy: Secondary | ICD-10-CM | POA: Diagnosis not present

## 2023-09-29 DIAGNOSIS — E559 Vitamin D deficiency, unspecified: Secondary | ICD-10-CM | POA: Diagnosis not present

## 2023-09-29 DIAGNOSIS — K5909 Other constipation: Secondary | ICD-10-CM | POA: Diagnosis not present

## 2023-09-29 DIAGNOSIS — N920 Excessive and frequent menstruation with regular cycle: Secondary | ICD-10-CM | POA: Diagnosis not present

## 2023-09-29 DIAGNOSIS — R7303 Prediabetes: Secondary | ICD-10-CM | POA: Diagnosis not present

## 2023-10-30 DIAGNOSIS — Z01419 Encounter for gynecological examination (general) (routine) without abnormal findings: Secondary | ICD-10-CM | POA: Diagnosis not present

## 2023-10-30 DIAGNOSIS — D259 Leiomyoma of uterus, unspecified: Secondary | ICD-10-CM | POA: Diagnosis not present

## 2023-10-30 DIAGNOSIS — N920 Excessive and frequent menstruation with regular cycle: Secondary | ICD-10-CM | POA: Diagnosis not present

## 2023-11-17 DIAGNOSIS — Z9189 Other specified personal risk factors, not elsewhere classified: Secondary | ICD-10-CM | POA: Diagnosis not present

## 2023-11-17 DIAGNOSIS — K5909 Other constipation: Secondary | ICD-10-CM | POA: Diagnosis not present

## 2023-11-17 DIAGNOSIS — E559 Vitamin D deficiency, unspecified: Secondary | ICD-10-CM | POA: Diagnosis not present

## 2023-11-17 DIAGNOSIS — R7303 Prediabetes: Secondary | ICD-10-CM | POA: Diagnosis not present

## 2023-11-17 DIAGNOSIS — N920 Excessive and frequent menstruation with regular cycle: Secondary | ICD-10-CM | POA: Diagnosis not present

## 2023-11-27 ENCOUNTER — Other Ambulatory Visit: Payer: Self-pay | Admitting: Physician Assistant

## 2023-11-27 ENCOUNTER — Other Ambulatory Visit: Payer: Self-pay | Admitting: Nurse Practitioner

## 2023-11-27 DIAGNOSIS — Z1231 Encounter for screening mammogram for malignant neoplasm of breast: Secondary | ICD-10-CM

## 2023-12-02 ENCOUNTER — Ambulatory Visit
Admission: RE | Admit: 2023-12-02 | Discharge: 2023-12-02 | Disposition: A | Discharge status: Home / Self Care | Payer: Self-pay | Source: Ambulatory Visit | Attending: Physician Assistant | Admitting: Physician Assistant

## 2023-12-02 DIAGNOSIS — Z1231 Encounter for screening mammogram for malignant neoplasm of breast: Secondary | ICD-10-CM

## 2023-12-25 DIAGNOSIS — R7303 Prediabetes: Secondary | ICD-10-CM | POA: Diagnosis not present

## 2023-12-25 DIAGNOSIS — Z9189 Other specified personal risk factors, not elsewhere classified: Secondary | ICD-10-CM | POA: Diagnosis not present

## 2023-12-25 DIAGNOSIS — N920 Excessive and frequent menstruation with regular cycle: Secondary | ICD-10-CM | POA: Diagnosis not present

## 2023-12-25 DIAGNOSIS — E559 Vitamin D deficiency, unspecified: Secondary | ICD-10-CM | POA: Diagnosis not present

## 2023-12-25 DIAGNOSIS — K5909 Other constipation: Secondary | ICD-10-CM | POA: Diagnosis not present

## 2024-01-07 ENCOUNTER — Inpatient Hospital Stay: Attending: Hematology and Oncology | Admitting: Hematology and Oncology

## 2024-01-07 ENCOUNTER — Inpatient Hospital Stay

## 2024-01-07 VITALS — BP 125/85 | HR 73 | Temp 97.9°F | Resp 14 | Wt 194.6 lb

## 2024-01-07 DIAGNOSIS — D500 Iron deficiency anemia secondary to blood loss (chronic): Secondary | ICD-10-CM

## 2024-01-07 DIAGNOSIS — N920 Excessive and frequent menstruation with regular cycle: Secondary | ICD-10-CM | POA: Diagnosis not present

## 2024-01-07 LAB — CMP (CANCER CENTER ONLY)
ALT: 8 U/L (ref 0–44)
AST: 11 U/L — ABNORMAL LOW (ref 15–41)
Albumin: 3.9 g/dL (ref 3.5–5.0)
Alkaline Phosphatase: 57 U/L (ref 38–126)
Anion gap: 3 — ABNORMAL LOW (ref 5–15)
BUN: 13 mg/dL (ref 6–20)
CO2: 30 mmol/L (ref 22–32)
Calcium: 8.9 mg/dL (ref 8.9–10.3)
Chloride: 107 mmol/L (ref 98–111)
Creatinine: 0.81 mg/dL (ref 0.44–1.00)
GFR, Estimated: >60 mL/min (ref >60)
Glucose, Bld: 90 mg/dL (ref 70–99)
Potassium: 4 mmol/L (ref 3.5–5.1)
Sodium: 140 mmol/L (ref 135–145)
Total Bilirubin: 0.4 mg/dL (ref 0.0–1.2)
Total Protein: 6.7 g/dL (ref 6.5–8.1)

## 2024-01-07 LAB — IRON AND IRON BINDING CAPACITY (CC-WL,HP ONLY)
Iron: 69 ug/dL (ref 28–170)
Saturation Ratios: 16 % (ref 10.4–31.8)
TIBC: 442 ug/dL (ref 250–450)
UIBC: 373 ug/dL (ref 148–442)

## 2024-01-07 LAB — RETIC PANEL
Immature Retic Fract: 21.4 % — ABNORMAL HIGH (ref 2.3–15.9)
RBC.: 3.92 MIL/uL (ref 3.87–5.11)
Retic Count, Absolute: 84.3 K/uL (ref 19.0–186.0)
Retic Ct Pct: 2.2 % (ref 0.4–3.1)
Reticulocyte Hemoglobin: 30.8 pg (ref >27.9)

## 2024-01-07 LAB — CBC WITH DIFFERENTIAL (CANCER CENTER ONLY)
Abs Immature Granulocytes: 0.01 K/uL (ref 0.00–0.07)
Basophils Absolute: 0 K/uL (ref 0.0–0.1)
Basophils Relative: 1 %
Eosinophils Absolute: 0 K/uL (ref 0.0–0.5)
Eosinophils Relative: 0 %
HCT: 32.4 % — ABNORMAL LOW (ref 36.0–46.0)
Hemoglobin: 10.7 g/dL — ABNORMAL LOW (ref 12.0–15.0)
Immature Granulocytes: 0 %
Lymphocytes Relative: 31 %
Lymphs Abs: 1.2 K/uL (ref 0.7–4.0)
MCH: 27 pg (ref 26.0–34.0)
MCHC: 33 g/dL (ref 30.0–36.0)
MCV: 81.8 fL (ref 80.0–100.0)
Monocytes Absolute: 0.2 K/uL (ref 0.1–1.0)
Monocytes Relative: 6 %
Neutro Abs: 2.4 K/uL (ref 1.7–7.7)
Neutrophils Relative %: 62 %
Platelet Count: 310 K/uL (ref 150–400)
RBC: 3.96 MIL/uL (ref 3.87–5.11)
RDW: 13.6 % (ref 11.5–15.5)
WBC Count: 3.9 K/uL — ABNORMAL LOW (ref 4.0–10.5)
nRBC: 0 % (ref 0.0–0.2)

## 2024-01-07 NOTE — Progress Notes (Signed)
 [facility] Telephone:([phone]   Fax:(336) [phone]  INITIAL CONSULT NOTE  Patient Care Team: Patient, No Pcp Per as PCP - General (General Practice)  Hematological/Oncological History # Iron Deficiency Anemia 2/2 to GYN Bleeding 10/30/2023: WBC 3.3, Hgb 11.2, MCV 79.2, Plt 303. Ferritin 3.7. Started PO iron  01/07/2024: establish care with Dr. Federico   CHIEF COMPLAINTS/PURPOSE OF CONSULTATION:  Iron Deficiency Anemia   HISTORY OF PRESENTING ILLNESS:  Erin Greene 43 y.o. female with medical history significant for fibroid and heavy menstrual cycles who presents for evaluation of iron deficiency anemia.  On review of the previous records Erin Greene had labs drawn on 10/30/2023 which showed WBC 3.3, Hgb 11.2, MCV 79.2, Plt 303. Ferritin 3.7. Started PO iron.  Due to concern for her iron deficiency anemia she was referred to hematology for further evaluation and management.  On exam today Erin Greene reports that she is currently taking p.o. iron therapy as well as tranexamic acid for her heavy menstrual cycles caused by fibroids.  She reports her cycles last for 5 to 7 days and she uses both pads and tampons, in particular ultra pads.  She reports that she can change them every 3 hours and they are saturated.  She is not having any bleeding elsewhere such as nosebleeds, gum bleeding, or dark stools.  She reports her energy levels are good and that she keeps going even if she is tired.  She notes she is having some lightheadedness but no dizziness or shortness of breath.  She reports she is doing her best to try to stay well-hydrated.  She reports that the iron pills are not providing her any relief but are not causing her any other symptoms such as constipation or stomach upset.  She reports that she is not vegetarian or vegan or having any special diets.  She notes that her daughter has a rare syndrome called HRD syndrome and that she has 2 cousins with iron deficiency  anemia.  Her mom has type 2 diabetes and her father is deceased from heart disease and type 2 diabetes.  Her maternal grandfather had lung cancer.  She notes that she is a never smoker but does drink alcohol occasionally.  Otherwise she denies any fevers, chills, sweats, nausea, vomiting or diarrhea.  A full 10 point ROS is otherwise negative.  MEDICAL HISTORY:  Past Medical History:  Diagnosis Date   Fibroid    IUD 03-17-2009   MIRENA    SURGICAL HISTORY: Past Surgical History:  Procedure Laterality Date   BREAST SURGERY     reduction   REDUCTION MAMMAPLASTY      SOCIAL HISTORY: Social History   Socioeconomic History   Marital status: Married    Spouse name: Not on file   Number of children: Not on file   Years of education: Not on file   Highest education level: Not on file  Occupational History   Not on file  Tobacco Use   Smoking status: Never   Smokeless tobacco: Never  Substance and Sexual Activity   Alcohol use: No    Comment: not since pregnancy   Drug use: No   Sexual activity: Yes    Birth control/protection: None  Other Topics Concern   Not on file  Social History Narrative   Not on file   Social Drivers of Health   Financial Resource Strain: Not on file  Food Insecurity: No Food Insecurity (01/07/2024)   Hunger Vital Sign    Worried  About Running Out of Food in the Last Year: Never true    Ran Out of Food in the Last Year: Never true  Transportation Needs: No Transportation Needs (01/07/2024)   PRAPARE - Administrator, Civil Service (Medical): No    Lack of Transportation (Non-Medical): No  Physical Activity: Not on file  Stress: Not on file  Social Connections: Unknown (05/24/2022)   Received from [facility]   Social Network    Social Network: Not on file  Intimate Partner Violence: Not At Risk (01/07/2024)   Humiliation, Afraid, Rape, and Kick questionnaire    Fear of Current or Ex-Partner: No    Emotionally Abused: No     Physically Abused: No    Sexually Abused: No    FAMILY HISTORY: Family History  Problem Relation Age of Onset   Diabetes Father    Hypertension Father    Breast cancer Neg Hx     ALLERGIES:  is allergic to apple juice.  MEDICATIONS:  Current Outpatient Medications  Medication Sig Dispense Refill   Cyanocobalamin  (VITAMIN B 12 PO) Take 1,000 mcg by mouth daily.     ferrous sulfate 325 (65 FE) MG EC tablet Take 325 mg by mouth daily with breakfast.     metFORMIN (GLUCOPHAGE-XR) 500 MG 24 hr tablet Take 500 mg by mouth 2 (two) times daily.     tranexamic acid (LYSTEDA) 650 MG TABS tablet Take 1,300 mg by mouth 3 (three) times daily. TID during menstrual cycle per pt report     VITAMIN D Take 1 tablet by mouth daily. 2,000 Units daily     Cobalamin Combinations (B-12) [phone] MCG SUBL      No current facility-administered medications for this visit.    REVIEW OF SYSTEMS:   Constitutional: ( - ) fevers, ( - )  chills , ( - ) night sweats Eyes: ( - ) blurriness of vision, ( - ) double vision, ( - ) watery eyes Ears, nose, mouth, throat, and face: ( - ) mucositis, ( - ) sore throat Respiratory: ( - ) cough, ( - ) dyspnea, ( - ) wheezes Cardiovascular: ( - ) palpitation, ( - ) chest discomfort, ( - ) lower extremity swelling Gastrointestinal:  ( - ) nausea, ( - ) heartburn, ( - ) change in bowel habits Skin: ( - ) abnormal skin rashes Lymphatics: ( - ) new lymphadenopathy, ( - ) easy bruising Neurological: ( - ) numbness, ( - ) tingling, ( - ) new weaknesses Behavioral/Psych: ( - ) mood change, ( - ) new changes  All other systems were reviewed with the patient and are negative.  PHYSICAL EXAMINATION:  Vitals:   01/07/24 1338  BP: 125/85  Pulse: 73  Resp: 14  Temp: 97.9 F (36.6 C)  SpO2: 98%   Filed Weights   01/07/24 1338  Weight: 194 lb 9.6 oz (88.3 kg)    GENERAL: well appearing young African-American female in NAD  SKIN: skin color, texture, turgor are normal,  no rashes or significant lesions EYES: conjunctiva are pink and non-injected, sclera clear LUNGS: clear to auscultation and percussion with normal breathing effort HEART: regular rate & rhythm and no murmurs and no lower extremity edema Musculoskeletal: no cyanosis of digits and no clubbing  PSYCH: alert & oriented x 3, fluent speech NEURO: no focal motor/sensory deficits  LABORATORY DATA:  I have reviewed the data as listed    Latest Ref Rng & Units 01/07/2024    2:38  PM 02/02/2017    3:31 PM  CBC  WBC 4.0 - 10.5 K/uL 3.9  5.4   Hemoglobin 12.0 - 15.0 g/dL 89.2  88.8   Hematocrit 36.0 - 46.0 % 32.4  32.3   Platelets 150 - 400 K/uL 310  274        Latest Ref Rng & Units 01/07/2024    2:38 PM  CMP  Glucose 70 - 99 mg/dL 90   BUN 6 - 20 mg/dL 13   Creatinine 9.55 - 1.00 mg/dL 9.18   Sodium 864 - 854 mmol/L 140   Potassium 3.5 - 5.1 mmol/L 4.0   Chloride 98 - 111 mmol/L 107   CO2 22 - 32 mmol/L 30   Calcium 8.9 - 10.3 mg/dL 8.9   Total Protein 6.5 - 8.1 g/dL 6.7   Total Bilirubin 0.0 - 1.2 mg/dL 0.4   Alkaline Phos 38 - 126 U/L 57   AST 15 - 41 U/L 11   ALT 0 - 44 U/L 8      ASSESSMENT & PLAN Carrigan Delafuente 43 y.o. female with medical history significant for fibroid and heavy menstrual cycles who presents for evaluation of iron deficiency anemia.  After review of the labs, review of the records, and discussion with the patient the patients findings are most consistent with iron deficiency anemia in the setting of heavy GYN bleeding with known fibroid.  # Iron Deficiency Anemia 2/2 to GYN Bleeding -- Findings are consistent with iron deficiency anemia secondary to patient's menorrhagia --Encouraged her to follow-up with OB/GYN for better control of her menstrual cycles --We will confirm iron deficiency anemia by ordering iron panel and ferritin as well as reticulocytes, CBC, and CMP --Continue ferrous sulfate 325 mg daily with a source of vitamin C --We will plan to proceed  with IV iron therapy in order to help bolster the patient's blood counts --Plan for return to clinic in 4 to 6 weeks time after last dose of IV iron   Orders Placed This Encounter  Procedures   CBC with Differential (Cancer Center Only)    Standing Status:   Future    Number of Occurrences:   1    Expiration Date:   01/06/2025   CMP (Cancer Center only)    Standing Status:   Future    Number of Occurrences:   1    Expiration Date:   01/06/2025   Ferritin    Standing Status:   Future    Number of Occurrences:   1    Expiration Date:   01/06/2025   Iron and Iron Binding Capacity ([facility] only)    Standing Status:   Future    Number of Occurrences:   1    Expiration Date:   01/06/2025   Retic Panel    Standing Status:   Future    Number of Occurrences:   1    Expiration Date:   01/06/2025    All questions were answered. The patient knows to call the clinic with any problems, questions or concerns.  A total of more than 60 minutes were spent on this encounter with face-to-face time and non-face-to-face time, including preparing to see the patient, ordering tests and/or medications, counseling the patient and coordination of care as outlined above.   Norleen IVAR Kidney, MD Department of Hematology/Oncology [facility] Phone: [phone] Pager: [phone] Email: [email or web address]  01/13/2024 10:17 AM

## 2024-01-08 LAB — FERRITIN: Ferritin: 9 ng/mL — ABNORMAL LOW (ref 11–307)

## 2024-01-13 ENCOUNTER — Telehealth: Payer: Self-pay

## 2024-01-13 ENCOUNTER — Encounter: Payer: Self-pay | Admitting: Hematology and Oncology

## 2024-01-13 ENCOUNTER — Other Ambulatory Visit: Payer: Self-pay | Admitting: Hematology and Oncology

## 2024-01-13 DIAGNOSIS — D500 Iron deficiency anemia secondary to blood loss (chronic): Secondary | ICD-10-CM | POA: Insufficient documentation

## 2024-01-13 NOTE — Telephone Encounter (Signed)
 Dr. Federico, patient will be scheduled as soon as possible.  Auth Submission: NO AUTH NEEDED Site of care: Site of care: [facility] Payer: BCBS commercial Medication & CPT/J Code(s) submitted: Venofer (Iron Sucrose) J1756 Diagnosis Code:  Route of submission (phone, fax, portal): phone Phone # [phone] Fax # Auth type: Buy/Bill PB Units/visits requested: 200mg  x 5 doses Reference number: [id number] Approval from: 01/13/24 to 05/14/24

## 2024-01-15 ENCOUNTER — Encounter: Payer: Self-pay | Admitting: Hematology and Oncology

## 2024-01-20 ENCOUNTER — Ambulatory Visit (INDEPENDENT_AMBULATORY_CARE_PROVIDER_SITE_OTHER)

## 2024-01-20 VITALS — BP 113/75 | HR 65 | Temp 98.0°F | Resp 18 | Ht 63.0 in | Wt 192.4 lb

## 2024-01-20 DIAGNOSIS — D500 Iron deficiency anemia secondary to blood loss (chronic): Secondary | ICD-10-CM | POA: Diagnosis not present

## 2024-01-20 DIAGNOSIS — N920 Excessive and frequent menstruation with regular cycle: Secondary | ICD-10-CM

## 2024-01-20 MED ORDER — IRON SUCROSE 20 MG/ML IV SOLN
200.0000 mg | Freq: Once | INTRAVENOUS | Status: AC
  Administered 2024-01-20 (×2): 200 mg via INTRAVENOUS
  Filled 2024-01-20: qty 10

## 2024-01-20 NOTE — Progress Notes (Signed)
 Diagnosis: Iron  Deficiency Anemia  Provider:  Praveen Mannam MD  Procedure: IV Push  IV Type: Peripheral, IV Location: L Antecubital  Venofer  (Iron  Sucrose), Dose: 200 mg  Post Infusion IV Care: Observation period completed and Peripheral IV Discontinued  Discharge: Condition: Good, Destination: Home . AVS Declined  Performed by:  Leita FORBES Miles, LPN

## 2024-01-20 NOTE — Patient Instructions (Signed)

## 2024-01-27 ENCOUNTER — Ambulatory Visit (INDEPENDENT_AMBULATORY_CARE_PROVIDER_SITE_OTHER)

## 2024-01-27 VITALS — BP 120/78 | HR 74 | Temp 98.9°F | Resp 18 | Ht 63.0 in | Wt 194.6 lb

## 2024-01-27 DIAGNOSIS — D500 Iron deficiency anemia secondary to blood loss (chronic): Secondary | ICD-10-CM | POA: Diagnosis not present

## 2024-01-27 DIAGNOSIS — N920 Excessive and frequent menstruation with regular cycle: Secondary | ICD-10-CM

## 2024-01-27 MED ORDER — IRON SUCROSE 20 MG/ML IV SOLN
200.0000 mg | Freq: Once | INTRAVENOUS | Status: AC
Start: 2024-01-27 — End: 2024-01-27
  Administered 2024-01-27: 200 mg via INTRAVENOUS
  Filled 2024-01-27: qty 10

## 2024-01-27 MED ORDER — SODIUM CHLORIDE 0.9 % IV BOLUS
250.0000 mL | Freq: Once | INTRAVENOUS | Status: AC
  Administered 2024-01-27: 250 mL via INTRAVENOUS
  Filled 2024-01-27: qty 250

## 2024-01-27 NOTE — Progress Notes (Signed)
 Diagnosis: Iron Deficiency Anemia  Provider:  Chilton Greathouse MD  Procedure: IV Push  IV Type: Peripheral, IV Location: L Antecubital  Venofer (Iron Sucrose), Dose: 200 mg  Post Infusion IV Care: Patient declined observation and Peripheral IV Discontinued  Discharge: Condition: Good, Destination: Home . AVS Declined  Performed by:  Adriana Mccallum, RN

## 2024-02-03 ENCOUNTER — Ambulatory Visit: Admitting: *Deleted

## 2024-02-03 VITALS — BP 125/79 | HR 57 | Temp 98.5°F | Resp 16 | Ht 63.0 in | Wt 193.0 lb

## 2024-02-03 DIAGNOSIS — N920 Excessive and frequent menstruation with regular cycle: Secondary | ICD-10-CM | POA: Diagnosis not present

## 2024-02-03 DIAGNOSIS — D500 Iron deficiency anemia secondary to blood loss (chronic): Secondary | ICD-10-CM | POA: Diagnosis not present

## 2024-02-03 MED ORDER — SODIUM CHLORIDE 0.9 % IV BOLUS
250.0000 mL | Freq: Once | INTRAVENOUS | Status: AC
  Administered 2024-02-03: 250 mL via INTRAVENOUS
  Filled 2024-02-03: qty 250

## 2024-02-03 MED ORDER — IRON SUCROSE 20 MG/ML IV SOLN
200.0000 mg | Freq: Once | INTRAVENOUS | Status: AC
  Administered 2024-02-03: 200 mg via INTRAVENOUS
  Filled 2024-02-03: qty 10

## 2024-02-03 MED ORDER — SODIUM CHLORIDE 0.9 % IV SOLN: 200.0000 mg | Freq: Once | INTRAVENOUS | Status: DC

## 2024-02-03 NOTE — Progress Notes (Signed)
 Diagnosis: Iron  Deficiency Anemia  Provider:  Mannam, Praveen MD  Procedure: IV Push  IV Type: Peripheral, IV Location: L Antecubital  Venofer  (Iron  Sucrose), Dose: 200 mg  Post Infusion IV Care: Patient declined observation and Peripheral IV Discontinued  Discharge: Condition: Good, Destination: Home . AVS Declined  Performed by:  Donnie Gedeon E, RN

## 2024-02-10 ENCOUNTER — Ambulatory Visit (INDEPENDENT_AMBULATORY_CARE_PROVIDER_SITE_OTHER)

## 2024-02-10 VITALS — BP 106/71 | HR 73 | Temp 98.0°F | Resp 20 | Ht 63.0 in | Wt 193.8 lb

## 2024-02-10 DIAGNOSIS — N920 Excessive and frequent menstruation with regular cycle: Secondary | ICD-10-CM

## 2024-02-10 DIAGNOSIS — D500 Iron deficiency anemia secondary to blood loss (chronic): Secondary | ICD-10-CM

## 2024-02-10 MED ORDER — SODIUM CHLORIDE 0.9 % IV BOLUS: 250.0000 mL | Freq: Once | INTRAVENOUS | Status: DC

## 2024-02-10 MED ORDER — SODIUM CHLORIDE 0.9 % IV SOLN
200.0000 mg | Freq: Once | INTRAVENOUS | Status: AC
  Administered 2024-02-10: 200 mg via INTRAVENOUS
  Filled 2024-02-10: qty 10

## 2024-02-10 NOTE — Progress Notes (Signed)
 Diagnosis: Iron  Deficiency Anemia  Provider:  Praveen Mannam MD  Procedure: IV Infusion  IV Type: Peripheral, IV Location: L Antecubital  Venofer  (Iron  Sucrose), Dose: 200 mg  Infusion Start Time: 1438  Infusion Stop Time: 1500  Post Infusion IV Care: Observation period completed and Peripheral IV Discontinued  Discharge: Condition: Good, Destination: Home . AVS Declined  Performed by:  Leita FORBES Miles, LPN

## 2024-02-17 ENCOUNTER — Ambulatory Visit (INDEPENDENT_AMBULATORY_CARE_PROVIDER_SITE_OTHER)

## 2024-02-17 VITALS — BP 116/68 | HR 65 | Temp 97.9°F | Resp 18 | Ht 63.0 in | Wt 195.0 lb

## 2024-02-17 DIAGNOSIS — D500 Iron deficiency anemia secondary to blood loss (chronic): Secondary | ICD-10-CM

## 2024-02-17 MED ORDER — SODIUM CHLORIDE 0.9 % IV SOLN
200.0000 mg | Freq: Once | INTRAVENOUS | Status: AC
  Administered 2024-02-17: 200 mg via INTRAVENOUS
  Filled 2024-02-17: qty 10

## 2024-02-17 MED ORDER — SODIUM CHLORIDE 0.9 % IV BOLUS
250.0000 mL | Freq: Once | INTRAVENOUS | Status: DC
  Filled 2024-02-17: qty 250

## 2024-02-17 NOTE — Progress Notes (Signed)
 Diagnosis: Iron  Deficiency Anemia  Provider:  Praveen Mannam MD  Procedure: IV Infusion  IV Type: Peripheral, IV Location: L Antecubital  Venofer  (Iron  Sucrose), Dose: 200 mg  Infusion Start Time: 1427  Infusion Stop Time: 1452  Post Infusion IV Care: Patient declined observation and Peripheral IV Discontinued  Discharge: Condition: Good, Destination: Home . AVS Declined  Performed by:  Leita FORBES Miles, LPN

## 2024-02-19 DIAGNOSIS — K5909 Other constipation: Secondary | ICD-10-CM | POA: Diagnosis not present

## 2024-02-19 DIAGNOSIS — E559 Vitamin D deficiency, unspecified: Secondary | ICD-10-CM | POA: Diagnosis not present

## 2024-02-19 DIAGNOSIS — N920 Excessive and frequent menstruation with regular cycle: Secondary | ICD-10-CM | POA: Diagnosis not present

## 2024-02-19 DIAGNOSIS — R7303 Prediabetes: Secondary | ICD-10-CM | POA: Diagnosis not present

## 2024-03-06 ENCOUNTER — Telehealth: Payer: Self-pay | Admitting: Hematology and Oncology

## 2024-03-06 NOTE — Telephone Encounter (Signed)
 Scheduled appointments per 9/23 inbasket message from Dr.Dorsey. Called and left VM with appointment details for the patient.

## 2024-04-06 ENCOUNTER — Other Ambulatory Visit: Payer: Self-pay | Admitting: Nurse Practitioner

## 2024-04-06 ENCOUNTER — Other Ambulatory Visit: Payer: Self-pay

## 2024-04-06 ENCOUNTER — Inpatient Hospital Stay: Attending: Hematology and Oncology

## 2024-04-06 DIAGNOSIS — N63 Unspecified lump in unspecified breast: Secondary | ICD-10-CM

## 2024-04-06 DIAGNOSIS — N644 Mastodynia: Secondary | ICD-10-CM | POA: Diagnosis not present

## 2024-04-06 DIAGNOSIS — D500 Iron deficiency anemia secondary to blood loss (chronic): Secondary | ICD-10-CM | POA: Diagnosis not present

## 2024-04-06 DIAGNOSIS — N920 Excessive and frequent menstruation with regular cycle: Secondary | ICD-10-CM | POA: Insufficient documentation

## 2024-04-06 LAB — CMP (CANCER CENTER ONLY)
ALT: 6 U/L (ref 0–44)
AST: 11 U/L — ABNORMAL LOW (ref 15–41)
Albumin: 4.4 g/dL (ref 3.5–5.0)
Alkaline Phosphatase: 53 U/L (ref 38–126)
Anion gap: 4 — ABNORMAL LOW (ref 5–15)
BUN: 12 mg/dL (ref 6–20)
CO2: 27 mmol/L (ref 22–32)
Calcium: 9 mg/dL (ref 8.9–10.3)
Chloride: 107 mmol/L (ref 98–111)
Creatinine: 0.87 mg/dL (ref 0.44–1.00)
GFR, Estimated: >60 mL/min (ref >60)
Glucose, Bld: 87 mg/dL (ref 70–99)
Potassium: 4.1 mmol/L (ref 3.5–5.1)
Sodium: 138 mmol/L (ref 135–145)
Total Bilirubin: 0.7 mg/dL (ref 0.0–1.2)
Total Protein: 7.2 g/dL (ref 6.5–8.1)

## 2024-04-06 LAB — CBC WITH DIFFERENTIAL (CANCER CENTER ONLY)
Abs Immature Granulocytes: 0.01 K/uL (ref 0.00–0.07)
Basophils Absolute: 0 K/uL (ref 0.0–0.1)
Basophils Relative: 1 %
Eosinophils Absolute: 0.1 K/uL (ref 0.0–0.5)
Eosinophils Relative: 3 %
HCT: 37 % (ref 36.0–46.0)
Hemoglobin: 13.4 g/dL (ref 12.0–15.0)
Immature Granulocytes: 0 %
Lymphocytes Relative: 34 %
Lymphs Abs: 1.3 K/uL (ref 0.7–4.0)
MCH: 29.5 pg (ref 26.0–34.0)
MCHC: 36.2 g/dL — ABNORMAL HIGH (ref 30.0–36.0)
MCV: 81.3 fL (ref 80.0–100.0)
Monocytes Absolute: 0.3 K/uL (ref 0.1–1.0)
Monocytes Relative: 7 %
Neutro Abs: 2.1 K/uL (ref 1.7–7.7)
Neutrophils Relative %: 55 %
Platelet Count: 296 K/uL (ref 150–400)
RBC: 4.55 MIL/uL (ref 3.87–5.11)
RDW: 12.7 % (ref 11.5–15.5)
WBC Count: 3.7 K/uL — ABNORMAL LOW (ref 4.0–10.5)
nRBC: 0 % (ref 0.0–0.2)

## 2024-04-06 LAB — RETIC PANEL
Immature Retic Fract: 5.3 % (ref 2.3–15.9)
RBC.: 4.5 MIL/uL (ref 3.87–5.11)
Retic Count, Absolute: 77.4 K/uL (ref 19.0–186.0)
Retic Ct Pct: 1.7 % (ref 0.4–3.1)
Reticulocyte Hemoglobin: 34.6 pg (ref >27.9)

## 2024-04-06 LAB — IRON AND IRON BINDING CAPACITY (CC-WL,HP ONLY)
Iron: 79 ug/dL (ref 28–170)
Saturation Ratios: 22 % (ref 10.4–31.8)
TIBC: 357 ug/dL (ref 250–450)
UIBC: 278 ug/dL (ref 148–442)

## 2024-04-06 LAB — FERRITIN: Ferritin: 137 ng/mL (ref 11–307)

## 2024-04-08 DIAGNOSIS — R7303 Prediabetes: Secondary | ICD-10-CM | POA: Diagnosis not present

## 2024-04-08 DIAGNOSIS — K5909 Other constipation: Secondary | ICD-10-CM | POA: Diagnosis not present

## 2024-04-08 DIAGNOSIS — E559 Vitamin D deficiency, unspecified: Secondary | ICD-10-CM | POA: Diagnosis not present

## 2024-04-08 DIAGNOSIS — N920 Excessive and frequent menstruation with regular cycle: Secondary | ICD-10-CM | POA: Diagnosis not present

## 2024-04-12 ENCOUNTER — Inpatient Hospital Stay: Attending: Physician Assistant | Admitting: Physician Assistant

## 2024-04-12 DIAGNOSIS — D509 Iron deficiency anemia, unspecified: Secondary | ICD-10-CM

## 2024-04-12 DIAGNOSIS — N920 Excessive and frequent menstruation with regular cycle: Secondary | ICD-10-CM

## 2024-04-12 DIAGNOSIS — D500 Iron deficiency anemia secondary to blood loss (chronic): Secondary | ICD-10-CM

## 2024-04-12 NOTE — Progress Notes (Signed)
 [facility] [phone]   [phone]  PROGRESS NOTE  I connected with Erin Greene  on 04/12/24 by telephone visit and verified that I am speaking with the correct person using two identifiers.   I discussed the limitations, risks, security and privacy concerns of performing an evaluation and management service by telemedicine and the availability of in-person appointments. I also discussed with the patient that there may be a patient responsible charge related to this service. The patient expressed understanding and agreed to proceed.   Patient's location: Patient's car Provider's location: Office  Patient Care Team: Patient, No Pcp Per as PCP - General (General Practice)  Hematological/Oncological History # Iron  Deficiency Anemia 2/2 to GYN Bleeding 10/30/2023: WBC 3.3, Hgb 11.2, MCV 79.2, Plt 303. Ferritin 3.7. Started PO iron   01/07/2024: establish care with Dr. Federico  01/20/2024-02/17/2024: Received IV venofer  200 mg x 5 doses  CHIEF COMPLAINTS/PURPOSE OF CONSULTATION:  Iron  Deficiency Anemia   HISTORY OF PRESENTING ILLNESS:  Erin Greene 43 y.o. female returns for a follow up for iron  deficiency anemia. She was last seen by Dr. Federico on 01/07/2024. In the interim, she received IV venofer  200 mg x 5 doses completed on 02/17/2024.   Ms. Zwilling reports her energy is minimally improved since receiving IV iron . She is currently on TXA to help with her heavy menstrual cycles. She denies any other signs of bleeding. She denies nausea, vomiting or bowel habit changes. She stopped taking iron  pills after starting IV iron  infusions. She denies fevers, chills, sweats, shortness of breath, chest pain or cough. She has no other complaints. Rest of the ROS is below.   MEDICAL HISTORY:  Past Medical History:  Diagnosis Date   Fibroid    IUD 03-17-2009   MIRENA    SURGICAL HISTORY: Past Surgical History:  Procedure Laterality Date   BREAST SURGERY      reduction   REDUCTION MAMMAPLASTY      SOCIAL HISTORY: Social History   Socioeconomic History   Marital status: Married    Spouse name: Not on file   Number of children: Not on file   Years of education: Not on file   Highest education level: Not on file  Occupational History   Not on file  Tobacco Use   Smoking status: Never   Smokeless tobacco: Never  Substance and Sexual Activity   Alcohol use: No    Comment: not since pregnancy   Drug use: No   Sexual activity: Yes    Birth control/protection: None  Other Topics Concern   Not on file  Social History Narrative   Not on file   Social Drivers of Health   Financial Resource Strain: Not on file  Food Insecurity: No Food Insecurity (01/07/2024)   Hunger Vital Sign    Worried About Running Out of Food in the Last Year: Never true    Ran Out of Food in the Last Year: Never true  Transportation Needs: No Transportation Needs (01/07/2024)   PRAPARE - Administrator, Civil Service (Medical): No    Lack of Transportation (Non-Medical): No  Physical Activity: Not on file  Stress: Not on file  Social Connections: Unknown (05/24/2022)   Received from [facility]   Social Network    Social Network: Not on file  Intimate Partner Violence: Not At Risk (01/07/2024)   Humiliation, Afraid, Rape, and Kick questionnaire    Fear of Current or Ex-Partner: No    Emotionally Abused: No  Physically Abused: No    Sexually Abused: No    FAMILY HISTORY: Family History  Problem Relation Age of Onset   Diabetes Father    Hypertension Father    Breast cancer Neg Hx     ALLERGIES:  is allergic to apple juice.  MEDICATIONS:  Current Outpatient Medications  Medication Sig Dispense Refill   Cobalamin Combinations (B-12) [id number] MCG SUBL      Cyanocobalamin  (VITAMIN B 12 PO) Take 1,000 mcg by mouth daily.     ferrous sulfate 325 (65 FE) MG EC tablet Take 325 mg by mouth daily with breakfast.     metFORMIN  (GLUCOPHAGE-XR) 500 MG 24 hr tablet Take 500 mg by mouth 2 (two) times daily.     tranexamic acid (LYSTEDA) 650 MG TABS tablet Take 1,300 mg by mouth 3 (three) times daily. TID during menstrual cycle per pt report     VITAMIN D Take 1 tablet by mouth daily. 2,000 Units daily     No current facility-administered medications for this visit.    REVIEW OF SYSTEMS:   Constitutional: ( - ) fevers, ( - )  chills , ( - ) night sweats Eyes: ( - ) blurriness of vision, ( - ) double vision, ( - ) watery eyes Ears, nose, mouth, throat, and face: ( - ) mucositis, ( - ) sore throat Respiratory: ( - ) cough, ( - ) dyspnea, ( - ) wheezes Cardiovascular: ( - ) palpitation, ( - ) chest discomfort, ( - ) lower extremity swelling Gastrointestinal:  ( - ) nausea, ( - ) heartburn, ( - ) change in bowel habits Skin: ( - ) abnormal skin rashes Lymphatics: ( - ) new lymphadenopathy, ( - ) easy bruising Neurological: ( - ) numbness, ( - ) tingling, ( - ) new weaknesses Behavioral/Psych: ( - ) mood change, ( - ) new changes  All other systems were reviewed with the patient and are negative.  PHYSICAL EXAMINATION: Not performed due to virtual visit.   LABORATORY DATA:  I have reviewed the data as listed    Latest Ref Rng & Units 04/06/2024    2:03 PM 01/07/2024    2:38 PM 02/02/2017    3:31 PM  CBC  WBC 4.0 - 10.5 K/uL 3.7  3.9  5.4   Hemoglobin 12.0 - 15.0 g/dL 86.5  89.2  88.8   Hematocrit 36.0 - 46.0 % 37.0  32.4  32.3   Platelets 150 - 400 K/uL 296  310  274        Latest Ref Rng & Units 04/06/2024    2:03 PM 01/07/2024    2:38 PM  CMP  Glucose 70 - 99 mg/dL 87  90   BUN 6 - 20 mg/dL 12  13   Creatinine 9.55 - 1.00 mg/dL 9.12  9.18   Sodium 864 - 145 mmol/L 138  140   Potassium 3.5 - 5.1 mmol/L 4.1  4.0   Chloride 98 - 111 mmol/L 107  107   CO2 22 - 32 mmol/L 27  30   Calcium 8.9 - 10.3 mg/dL 9.0  8.9   Total Protein 6.5 - 8.1 g/dL 7.2  6.7   Total Bilirubin 0.0 - 1.2 mg/dL 0.7  0.4    Alkaline Phos 38 - 126 U/L 53  57   AST 15 - 41 U/L 11  11   ALT 0 - 44 U/L 6  8      ASSESSMENT & PLAN Erin Greene  43 y.o. female who presents for a follow up visit for iron  deficiency anemia.  # Iron  Deficiency Anemia 2/2 to GYN Bleeding --Findings are consistent with iron  deficiency anemia secondary to patient's menorrhagia --Under the care of [facility] and currently on TXA to help improve menstrual bleeding.  --Last received IV Venofer  200 mg x 5 doses, completed on 02/17/2024.  --Labs from 04/06/2024 show no evidence of anemia with Hgb 13.4, MCV 81.3. Iron  panel shows no deficiency with ferritin 137, saturation 22%, TIBC 357.  --Continue ferrous sulfate 325 mg daily with a source of vitamin C, okay to take every other day.  --No need for additional IV iron  at this time.  --RTC in 3 months for labs only and 6 months for labs/follow up.    No orders of the defined types were placed in this encounter.   All questions were answered. The patient knows to call the clinic with any problems, questions or concerns.   I have spent a total of 20 minutes minutes of non-face-to-face time, preparing to see the patient,  performing a medically appropriate examination, counseling and educating the patient, documenting clinical information in the electronic health record, independently interpreting results and communicating results to the patient, and care coordination.   Johnston Police PA-C Dept of Hematology and Oncology [facility] Phone: [phone]   04/12/2024 2:11 PM

## 2024-04-13 ENCOUNTER — Inpatient Hospital Stay: Admitting: Physician Assistant

## 2024-04-15 ENCOUNTER — Other Ambulatory Visit

## 2024-04-15 ENCOUNTER — Encounter

## 2024-05-19 DIAGNOSIS — R7303 Prediabetes: Secondary | ICD-10-CM | POA: Diagnosis not present

## 2024-05-19 DIAGNOSIS — N920 Excessive and frequent menstruation with regular cycle: Secondary | ICD-10-CM | POA: Diagnosis not present

## 2024-05-19 DIAGNOSIS — E559 Vitamin D deficiency, unspecified: Secondary | ICD-10-CM | POA: Diagnosis not present

## 2024-05-19 DIAGNOSIS — K5909 Other constipation: Secondary | ICD-10-CM | POA: Diagnosis not present

## 2024-07-12 ENCOUNTER — Other Ambulatory Visit: Payer: Self-pay | Admitting: Physician Assistant

## 2024-07-12 DIAGNOSIS — D500 Iron deficiency anemia secondary to blood loss (chronic): Secondary | ICD-10-CM

## 2024-07-13 ENCOUNTER — Inpatient Hospital Stay: Attending: Physician Assistant

## 2024-07-13 DIAGNOSIS — D500 Iron deficiency anemia secondary to blood loss (chronic): Secondary | ICD-10-CM

## 2024-07-13 LAB — CMP (CANCER CENTER ONLY)
ALT: 8 U/L (ref 0–44)
AST: 14 U/L — ABNORMAL LOW (ref 15–41)
Albumin: 4.3 g/dL (ref 3.5–5.0)
Alkaline Phosphatase: 68 U/L (ref 38–126)
Anion gap: 10 (ref 5–15)
BUN: 10 mg/dL (ref 6–20)
CO2: 26 mmol/L (ref 22–32)
Calcium: 9.1 mg/dL (ref 8.9–10.3)
Chloride: 105 mmol/L (ref 98–111)
Creatinine: 0.86 mg/dL (ref 0.44–1.00)
GFR, Estimated: >60 mL/min
Glucose, Bld: 81 mg/dL (ref 70–99)
Potassium: 4 mmol/L (ref 3.5–5.1)
Sodium: 141 mmol/L (ref 135–145)
Total Bilirubin: 0.3 mg/dL (ref 0.0–1.2)
Total Protein: 7.1 g/dL (ref 6.5–8.1)

## 2024-07-13 LAB — CBC WITH DIFFERENTIAL (CANCER CENTER ONLY)
Abs Immature Granulocytes: 0.01 10*3/uL (ref 0.00–0.07)
Basophils Absolute: 0 10*3/uL (ref 0.0–0.1)
Basophils Relative: 1 %
Eosinophils Absolute: 0.1 10*3/uL (ref 0.0–0.5)
Eosinophils Relative: 3 %
HCT: 35.2 % — ABNORMAL LOW (ref 36.0–46.0)
Hemoglobin: 12.8 g/dL (ref 12.0–15.0)
Immature Granulocytes: 0 %
Lymphocytes Relative: 34 %
Lymphs Abs: 1.5 10*3/uL (ref 0.7–4.0)
MCH: 29.9 pg (ref 26.0–34.0)
MCHC: 36.4 g/dL — ABNORMAL HIGH (ref 30.0–36.0)
MCV: 82.2 fL (ref 80.0–100.0)
Monocytes Absolute: 0.3 10*3/uL (ref 0.1–1.0)
Monocytes Relative: 7 %
Neutro Abs: 2.5 10*3/uL (ref 1.7–7.7)
Neutrophils Relative %: 55 %
Platelet Count: 339 10*3/uL (ref 150–400)
RBC: 4.28 MIL/uL (ref 3.87–5.11)
RDW: 12.3 % (ref 11.5–15.5)
WBC Count: 4.4 10*3/uL (ref 4.0–10.5)
nRBC: 0 % (ref 0.0–0.2)

## 2024-07-13 LAB — IRON AND IRON BINDING CAPACITY (CC-WL,HP ONLY)
Iron: 64 ug/dL (ref 28–170)
Saturation Ratios: 18 % (ref 10.4–31.8)
TIBC: 364 ug/dL (ref 250–450)
UIBC: 300 ug/dL

## 2024-07-13 LAB — FERRITIN: Ferritin: 36 ng/mL (ref 11–307)

## 2024-07-15 ENCOUNTER — Other Ambulatory Visit: Payer: Self-pay | Admitting: Physician Assistant

## 2024-07-16 ENCOUNTER — Telehealth: Payer: Self-pay | Admitting: Pharmacy Technician

## 2024-07-16 ENCOUNTER — Ambulatory Visit: Payer: Self-pay

## 2024-07-16 NOTE — Telephone Encounter (Signed)
 Auth Submission: NO AUTH NEEDED Site of care: Site of care: [facility] Payer: [employer] Medication & CPT/J Code(s) submitted: Venofer  (Iron  Sucrose) J1756 Diagnosis Code: D50.0 Route of submission (phone, fax, portal):  Phone # Fax # Auth type: Buy/Bill PB Units/visits requested: 3 DOSES Reference number:  Approval from: 07/16/24 to 12/07/24

## 2024-10-12 ENCOUNTER — Inpatient Hospital Stay: Admitting: Physician Assistant

## 2024-10-12 ENCOUNTER — Inpatient Hospital Stay
